# Patient Record
Sex: Male | Born: 1985 | Race: White | Hispanic: No | Marital: Married | Smoking: Current every day smoker
Health system: Southern US, Community
[De-identification: ages and names within clinical notes are randomized; demographics above are authoritative.]

## PROBLEM LIST (undated history)

## (undated) ENCOUNTER — Ambulatory Visit (HOSPITAL_COMMUNITY)
Disposition: A | Discharge status: Home / Self Care | Payer: 59 | Source: Ambulatory Visit | Attending: Psychiatry | Admitting: Psychiatry

## (undated) DIAGNOSIS — M542 Cervicalgia: Secondary | ICD-10-CM

## (undated) HISTORY — PX: ADENOIDECTOMY: SUR15

## (undated) HISTORY — PX: TONSILLECTOMY: SUR1361

## (undated) HISTORY — PX: BACK SURGERY: SHX140

## (undated) NOTE — *Deleted (*Deleted)
Physician Discharge Summary Note  Patient:  Philip Lee is an 50 y.o., male MRN:  [id number] DOB:  1986/01/19 Patient phone:  [phone] (home)  Patient address:   [address] [place] [address],  Total Time spent with patient: {Time; 15 min - 8 hours:17441}  Date of Admission:  06/21/2020 Date of Discharge: ***  Reason for Admission:  ***  Principal Problem: Severe recurrent major depression without psychotic features [facility]) Discharge Diagnoses: Principal Problem:   Severe recurrent major depression without psychotic features (HCC) Active Problems:   Panic attack   Generalized anxiety disorder   Marijuana use   Past Psychiatric History: ***  Past Medical History:  Past Medical History:  Diagnosis Date  . Cervical pain     Past Surgical History:  Procedure Laterality Date  . ADENOIDECTOMY    . BACK SURGERY    . TONSILLECTOMY     Family History: History reviewed. No pertinent family history. Family Psychiatric  History: *** Social History:  Social History   Substance and Sexual Activity  Alcohol Use Not Currently     Social History   Substance and Sexual Activity  Drug Use Not Currently    Social History   Socioeconomic History  . Marital status: Married    Spouse name: Not on file  . Number of children: Not on file  . Years of education: Not on file  . Highest education level: Not on file  Occupational History  . Not on file  Tobacco Use  . Smoking status: Current Every Day Smoker    Packs/day: 0.50    Types: Cigarettes  . Smokeless tobacco: Never Used  Substance and Sexual Activity  . Alcohol use: Not Currently  . Drug use: Not Currently  . Sexual activity: Not on file  Other Topics Concern  . Not on file  Social History Narrative  . Not on file   Social Determinants of Health   Financial Resource Strain:   . Difficulty of Paying Living Expenses: Not on file  Food Insecurity:   . Worried About Programme researcher, broadcasting/film/video in the  Last Year: Not on file  . Ran Out of Food in the Last Year: Not on file  Transportation Needs:   . Lack of Transportation (Medical): Not on file  . Lack of Transportation (Non-Medical): Not on file  Physical Activity:   . Days of Exercise per Week: Not on file  . Minutes of Exercise per Session: Not on file  Stress:   . Feeling of Stress : Not on file  Social Connections:   . Frequency of Communication with Friends and Family: Not on file  . Frequency of Social Gatherings with Friends and Family: Not on file  . Attends Religious Services: Not on file  . Active Member of Clubs or Organizations: Not on file  . Attends Banker Meetings: Not on file  . Marital Status: Not on file    Hospital Course:  ***  Physical Findings: AIMS: Facial and Oral Movements Muscles of Facial Expression: None, normal Lips and Perioral Area: None, normal Jaw: None, normal Tongue: None, normal,Extremity Movements Upper (arms, wrists, hands, fingers): None, normal Lower (legs, knees, ankles, toes): None, normal, Trunk Movements Neck, shoulders, hips: None, normal, Overall Severity Severity of abnormal movements (highest score from questions above): None, normal Incapacitation due to abnormal movements: None, normal Patient's awareness of abnormal movements (rate only patient's report): No Awareness, Dental Status Current problems with teeth and/or dentures?: No Does patient  usually wear dentures?: No  CIWA:  CIWA-Ar Total: 5 COWS:  COWS Total Score: 0  Musculoskeletal: Strength & Muscle Tone: {desc; muscle tone:32375} Gait & Station: {PE GAIT ED WJXB:14782} Patient leans: {Patient Leans:21022755}  Psychiatric Specialty Exam: Physical Exam  Review of Systems  Blood pressure 106/65, pulse (!) 51, temperature 98.5 F (36.9 C), temperature source Oral, resp. rate 20, height 5\' 10"  (1.778 m), weight 73.5 kg, SpO2 100 %.Body mass index is 23.24 kg/m.  General Appearance:  {Appearance:22683}  Eye Contact:  {BHH EYE CONTACT:22684}  Speech:  {Speech:22685}  Volume:  {Volume (PAA):22686}  Mood:  {BHH MOOD:22306}  Affect:  {Affect (PAA):22687}  Thought Process:  {Thought Process (PAA):22688}  Orientation:  {BHH ORIENTATION (PAA):22689}  Thought Content:  {Thought Content:22690}  Suicidal Thoughts:  {ST/HT (PAA):22692}  Homicidal Thoughts:  {ST/HT (PAA):22692}  Memory:  {BHH MEMORY:22881}  Judgement:  {Judgement (PAA):22694}  Insight:  {Insight (PAA):22695}  Psychomotor Activity:  {Psychomotor (PAA):22696}  Concentration:  {Concentration:21399}  Recall:  {BHH GOOD/FAIR/POOR:22877}  Fund of Knowledge:  {BHH GOOD/FAIR/POOR:22877}  Language:  {BHH GOOD/FAIR/POOR:22877}  Akathisia:  {BHH YES OR NO:22294}  Handed:  {Handed:22697}  AIMS (if indicated):     Assets:  {Assets (PAA):22698}  ADL's:  {BHH NFA'O:13086}  Cognition:  {chl bhh cognition:304700322}  Sleep:  Number of Hours: 6.75     Have you used any form of tobacco in the last 30 days? (Cigarettes, Smokeless Tobacco, Cigars, and/or Pipes): Yes  Has this patient used any form of tobacco in the last 30 days? (Cigarettes, Smokeless Tobacco, Cigars, and/or Pipes) Yes, {CHL BHH Tobacco:304700208}  Blood Alcohol level:  No results found for: [facility]  Metabolic Disorder Labs:  Lab Results  Component Value Date   HGBA1C 5.2 06/21/2020   MPG 103 06/21/2020   No results found for: PROLACTIN Lab Results  Component Value Date   CHOL 191 06/21/2020   TRIG 76 06/21/2020   HDL 53 06/21/2020   CHOLHDL 3.6 06/21/2020   VLDL 15 06/21/2020   LDLCALC 123 (H) 06/21/2020    See Psychiatric Specialty Exam and Suicide Risk Assessment completed by Attending Physician prior to discharge.  Discharge destination:  {DISCHARGE DESTINATION:22616}  Is patient on multiple antipsychotic therapies at discharge:  {RECOMMEND TAPERING:22617}   Has Patient had three or more failed trials of antipsychotic monotherapy by  history:  {BHH ANTIPSYCHOTIC:22903}  Recommended Plan for Multiple Antipsychotic Therapies: {BHH MULTIPLE ANTIPSYCHOTIC THERAPIES:22905}  Discharge Instructions    Discharge instructions   Complete by: As directed    Activity as tolerated. Diet as recommended by primary care physician. Keep all scheduled follow-up appointments as recommended.     Allergies as of 06/24/2020      Reactions   Lactose Intolerance (gi)       Medication List    STOP taking these medications   cyclobenzaprine 10 MG tablet Commonly known as: FLEXERIL   FLUoxetine 20 MG capsule Commonly known as: PROZAC     TAKE these medications     Indication  DULoxetine HCl 40 MG Cpep Take 40 mg by mouth daily. Start taking on: June 25, 2020  Indication: Major Depressive Disorder   hydrOXYzine 25 MG tablet Commonly known as: ATARAX/VISTARIL Take 1 tablet (25 mg total) by mouth 3 (three) times daily as needed for anxiety.  Indication: Feeling Anxious   traZODone 50 MG tablet Commonly known as: DESYREL Take 1 tablet (50 mg total) by mouth at bedtime as needed for sleep.  Indication: Trouble Sleeping  Follow-up Information    BEHAVIORAL HEALTH OUTPATIENT THERAPY [place] Follow up on 07/02/2020.   Specialty: Behavioral Health Why: You have an appointment on 07/02/20 at 2:00 pm for therapy services.  This will be a Virtual appointment.  Contact information: [address] [id number] mc [place] [address] [phone]       [facility] Go on 07/09/2020.   Why: You have an appointment for medication management on 07/09/20 at 10:00 am.  This appointment will be held in person.  Contact information: [address] [place], [place] [address]  Phone: [phone]  Fax: [phone]               Follow-up recommendations:  {BHH DC FU RECOMMENDATIONS:22620}  Comments:  ***  Signed: Aldean Baker, NP 06/24/2020, 1:09 PM

---

## 2012-04-15 ENCOUNTER — Encounter (HOSPITAL_BASED_OUTPATIENT_CLINIC_OR_DEPARTMENT_OTHER): Payer: Self-pay

## 2012-04-15 ENCOUNTER — Emergency Department (HOSPITAL_BASED_OUTPATIENT_CLINIC_OR_DEPARTMENT_OTHER)
Admission: EM | Admit: 2012-04-15 | Discharge: 2012-04-15 | Disposition: A | Discharge status: Home / Self Care | Payer: Self-pay | Attending: Emergency Medicine | Admitting: Emergency Medicine

## 2012-04-15 DIAGNOSIS — K047 Periapical abscess without sinus: Secondary | ICD-10-CM | POA: Insufficient documentation

## 2012-04-15 MED ORDER — HYDROCODONE-ACETAMINOPHEN 5-325 MG PO TABS
1.0000 | ORAL_TABLET | Freq: Once | ORAL | Status: AC
  Administered 2012-04-15: 1 via ORAL
  Filled 2012-04-15: qty 1

## 2012-04-15 MED ORDER — HYDROCODONE-ACETAMINOPHEN 5-500 MG PO TABS: 1.0000 | ORAL_TABLET | Freq: Four times a day (QID) | ORAL | Status: AC | PRN

## 2012-04-15 MED ORDER — PENICILLIN V POTASSIUM 500 MG PO TABS: 500.0000 mg | ORAL_TABLET | Freq: Four times a day (QID) | ORAL | Status: AC

## 2012-04-15 MED ORDER — IBUPROFEN 600 MG PO TABS: 600.0000 mg | ORAL_TABLET | Freq: Four times a day (QID) | ORAL | Status: AC | PRN

## 2012-04-15 NOTE — ED Provider Notes (Signed)
History     CSN: [id number]  Arrival date & time 04/15/12  1729   First MD Initiated Contact with Patient 04/15/12 1735      Chief Complaint  Patient presents with  . Dental Pain    (Consider location/radiation/quality/duration/timing/severity/associated sxs/prior treatment) Patient is a 26 y.o. male presenting with tooth pain. The history is provided by the patient.  Dental PainThe primary symptoms include mouth pain. Primary symptoms do not include headaches, fever or sore throat. The symptoms began 3 to 5 days ago. The symptoms are worsening. The symptoms are new. The symptoms occur constantly.  Additional symptoms include: gum swelling, gum tenderness, jaw pain and facial swelling. Additional symptoms do not include: trismus, trouble swallowing, pain with swallowing and ear pain.  Pt with left lower dental pain for 6 days, facial swelling and increased pain for two days. States pain with chewing and palpation of the gum and tooth. No fever, chills, malaise. Taking ibuprofen with mild relief.   History reviewed. No pertinent past medical history.  Past Surgical History  Procedure Date  . Tonsillectomy   . Adenoidectomy     No family history on file.  History  Substance Use Topics  . Smoking status: Current Some Day Smoker  . Smokeless tobacco: Not on file  . Alcohol Use: Yes      Review of Systems  Constitutional: Negative for fever and chills.  HENT: Positive for facial swelling and dental problem. Negative for ear pain, sore throat and trouble swallowing.   Respiratory: Negative.   Cardiovascular: Negative.   Musculoskeletal: Negative for myalgias and arthralgias.  Skin: Negative for rash.  Neurological: Negative for dizziness, weakness and headaches.    Allergies  Review of patient's allergies indicates no known allergies.  Home Medications   Current Outpatient Rx  Name Route Sig Dispense Refill  . IBUPROFEN 200 MG PO TABS Oral Take 400 mg by mouth every 6  (six) hours as needed. For pain.      BP 116/67  Pulse 79  Temp 98.2 F (36.8 C) (Oral)  Resp 16  Ht 5\' 10"  (1.778 m)  Wt 160 lb (72.576 kg)  BMI 22.96 kg/m2  SpO2 99%  Physical Exam  Nursing note and vitals reviewed. Constitutional: He is oriented to person, place, and time. He appears well-developed and well-nourished. No distress.  HENT:       Left lower 1st molar appears to have carries with surrounding gum swelling, tenderness. Small dental abscess present  Eyes: Conjunctivae are normal.  Neck: Neck supple.  Cardiovascular: Normal rate, regular rhythm and normal heart sounds.   Pulmonary/Chest: Effort normal and breath sounds normal. No respiratory distress. He has no wheezes.  Musculoskeletal: Normal range of motion.  Lymphadenopathy:    He has no cervical adenopathy.  Neurological: He is alert and oriented to person, place, and time.  Skin: Skin is warm and dry.  Psychiatric: He has a normal mood and affect.    ED Course  Procedures (including critical care time)  Pt with left lower 1st molar dental abscess. Will start on antibiotics, pain management. Follow up with dentist/oral surgery. Pt otherwise non toxic. Afebrile.    1. Dental abscess       MDM          Lottie Mussel, PA 04/15/12 2232

## 2012-04-15 NOTE — ED Notes (Signed)
Left lower toothache x 1 week-swelling noted to jaw

## 2012-04-17 NOTE — ED Provider Notes (Signed)
Medical screening examination/treatment/procedure(s) were performed by non-physician practitioner and as supervising physician I was immediately available for consultation/collaboration.   Willisha Sligar, MD 04/17/12 0721 

## 2013-04-03 ENCOUNTER — Encounter (HOSPITAL_BASED_OUTPATIENT_CLINIC_OR_DEPARTMENT_OTHER): Payer: Self-pay | Admitting: *Deleted

## 2013-04-03 ENCOUNTER — Emergency Department (HOSPITAL_BASED_OUTPATIENT_CLINIC_OR_DEPARTMENT_OTHER)
Admission: EM | Admit: 2013-04-03 | Discharge: 2013-04-04 | Disposition: A | Discharge status: Home / Self Care | Payer: Self-pay | Attending: Emergency Medicine | Admitting: Emergency Medicine

## 2013-04-03 DIAGNOSIS — X088XXA Exposure to other specified smoke, fire and flames, initial encounter: Secondary | ICD-10-CM | POA: Insufficient documentation

## 2013-04-03 DIAGNOSIS — Y929 Unspecified place or not applicable: Secondary | ICD-10-CM | POA: Insufficient documentation

## 2013-04-03 DIAGNOSIS — T2640XA Burn of unspecified eye and adnexa, part unspecified, initial encounter: Secondary | ICD-10-CM | POA: Insufficient documentation

## 2013-04-03 DIAGNOSIS — F17200 Nicotine dependence, unspecified, uncomplicated: Secondary | ICD-10-CM | POA: Insufficient documentation

## 2013-04-03 DIAGNOSIS — Y9389 Activity, other specified: Secondary | ICD-10-CM | POA: Insufficient documentation

## 2013-04-03 DIAGNOSIS — T2641XA Burn of right eye and adnexa, part unspecified, initial encounter: Secondary | ICD-10-CM

## 2013-04-03 MED ORDER — ERYTHROMYCIN 5 MG/GM OP OINT
TOPICAL_OINTMENT | Freq: Four times a day (QID) | OPHTHALMIC | Status: DC
  Administered 2013-04-03: 1 via OPHTHALMIC
  Filled 2013-04-03: qty 3.5

## 2013-04-03 MED ORDER — HYDROCODONE-ACETAMINOPHEN 5-325 MG PO TABS: 1.0000 | ORAL_TABLET | Freq: Four times a day (QID) | ORAL | Status: DC | PRN

## 2013-04-03 MED ORDER — FLUORESCEIN SODIUM 1 MG OP STRP
ORAL_STRIP | OPHTHALMIC | Status: AC
  Administered 2013-04-03: 1
  Filled 2013-04-03: qty 1

## 2013-04-03 MED ORDER — HYDROCODONE-ACETAMINOPHEN 5-325 MG PO TABS
1.0000 | ORAL_TABLET | Freq: Once | ORAL | Status: AC
  Administered 2013-04-03: 1 via ORAL
  Filled 2013-04-03: qty 1

## 2013-04-03 MED ORDER — TETRACAINE HCL 0.5 % OP SOLN
OPHTHALMIC | Status: AC
  Administered 2013-04-03: 2 [drp]
  Filled 2013-04-03: qty 2
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
## 2013-09-28 ENCOUNTER — Emergency Department (HOSPITAL_BASED_OUTPATIENT_CLINIC_OR_DEPARTMENT_OTHER)
Admission: EM | Admit: 2013-09-28 | Discharge: 2013-09-28 | Disposition: A | Discharge status: Home / Self Care | Payer: BC Managed Care – PPO | Attending: Emergency Medicine | Admitting: Emergency Medicine

## 2013-09-28 ENCOUNTER — Encounter (HOSPITAL_BASED_OUTPATIENT_CLINIC_OR_DEPARTMENT_OTHER): Payer: Self-pay | Admitting: Emergency Medicine

## 2013-09-28 DIAGNOSIS — F17200 Nicotine dependence, unspecified, uncomplicated: Secondary | ICD-10-CM | POA: Insufficient documentation

## 2013-09-28 DIAGNOSIS — K0381 Cracked tooth: Secondary | ICD-10-CM | POA: Insufficient documentation

## 2013-09-28 DIAGNOSIS — K029 Dental caries, unspecified: Secondary | ICD-10-CM | POA: Insufficient documentation

## 2013-09-28 MED ORDER — MELOXICAM 7.5 MG PO TABS
7.5000 mg | ORAL_TABLET | Freq: Every day | ORAL | Status: DC
Start: 2013-09-28 — End: 2015-07-09

## 2013-09-28 MED ORDER — PENICILLIN V POTASSIUM 250 MG PO TABS
500.0000 mg | ORAL_TABLET | Freq: Once | ORAL | Status: AC
  Administered 2013-09-28: 500 mg via ORAL
  Filled 2013-09-28: qty 2

## 2013-09-28 MED ORDER — OXYCODONE-ACETAMINOPHEN 5-325 MG PO TABS
1.0000 | ORAL_TABLET | Freq: Once | ORAL | Status: AC
  Administered 2013-09-28: 1 via ORAL
  Filled 2013-09-28: qty 1

## 2013-09-28 MED ORDER — PENICILLIN V POTASSIUM 500 MG PO TABS: 500.0000 mg | ORAL_TABLET | Freq: Four times a day (QID) | ORAL | Status: AC

## 2013-09-28 NOTE — Discharge Instructions (Signed)
Dental Care and Dentist Visits Dental care supports good overall health. Regular dental visits can also help you avoid dental pain, bleeding, infection, and other more serious health problems in the future. It is important to keep the mouth healthy because diseases in the teeth, gums, and other oral tissues can spread to other areas of the body. Some problems, such as diabetes, heart disease, and pre-term labor have been associated with poor oral health.  See your dentist every 6 months. If you experience emergency problems such as a toothache or broken tooth, go to the dentist right away. If you see your dentist regularly, you may catch problems early. It is easier to be treated for problems in the early stages.  WHAT TO EXPECT AT A DENTIST VISIT  Your dentist will look for many common oral health problems and recommend proper treatment. At your regular dental visit, you can expect:  Gentle cleaning of the teeth and gums. This includes scraping and polishing. This helps to remove the sticky substance around the teeth and gums (plaque). Plaque forms in the mouth shortly after eating. Over time, plaque hardens on the teeth as tartar. If tartar is not removed regularly, it can cause problems. Cleaning also helps remove stains.  Periodic X-rays. These pictures of the teeth and supporting bone will help your dentist assess the health of your teeth.  Periodic fluoride treatments. Fluoride is a natural mineral shown to help strengthen teeth. Fluoride treatmentinvolves applying a fluoride gel or varnish to the teeth. It is most commonly done in children.  Examination of the mouth, tongue, jaws, teeth, and gums to look for any oral health problems, such as:  Cavities (dental caries). This is decay on the tooth caused by plaque, sugar, and acid in the mouth. It is best to catch a cavity when it is small.  Inflammation of the gums caused by plaque buildup (gingivitis).  Problems with the mouth or malformed  or misaligned teeth.  Oral cancer or other diseases of the soft tissues or jaws. KEEP YOUR TEETH AND GUMS HEALTHY For healthy teeth and gums, follow these general guidelines as well as your dentist's specific advice:  Have your teeth professionally cleaned at the dentist every 6 months.  Brush twice daily with a fluoride toothpaste.  Floss your teeth daily.  Ask your dentist if you need fluoride supplements, treatments, or fluoride toothpaste.  Eat a healthy diet. Reduce foods and drinks with added sugar.  Avoid smoking. TREATMENT FOR ORAL HEALTH PROBLEMS If you have oral health problems, treatment varies depending on the conditions present in your teeth and gums.  Your caregiver will most likely recommend good oral hygiene at each visit.  For cavities, gingivitis, or other oral health disease, your caregiver will perform a procedure to treat the problem. This is typically done at a separate appointment. Sometimes your caregiver will refer you to another dental specialist for specific tooth problems or for surgery. SEEK IMMEDIATE DENTAL CARE IF:  You have pain, bleeding, or soreness in the gum, tooth, jaw, or mouth area.  A permanent tooth becomes loose or separated from the gum socket.  You experience a blow or injury to the mouth or jaw area. Document Released: 05/07/2011 Document Revised: 11/17/2011 Document Reviewed: 05/07/2011 [employer] Patient Information 2014 [employer].  

## 2013-09-28 NOTE — ED Notes (Signed)
Pt c/o dental pain x 3 days.

## 2013-09-28 NOTE — ED Provider Notes (Signed)
CSN: [id number]     Arrival date & time 09/28/13  2119 History  This chart was scribed for Philip Benzel Smitty CordsK Korbin Mapps-Rasch, MD by Philip Lee, ED scribe.  This patient was seen in room MH09/MH09 and the patient's care was started at 11:22 PM.   Chief Complaint  Patient presents with  . Dental Pain    Patient is a 28 y.o. male presenting with tooth pain. The history is provided by the patient. No language interpreter was used.  Dental Pain Location:  Lower Lower teeth location:  19/LL 1st molar (left) Quality:  Dull Severity:  Moderate Onset quality:  Gradual Duration:  3 days Timing:  Constant Progression:  Worsening Chronicity:  New Context: dental fracture   Context comment:  Cracked tooth Relieved by:  Acetaminophen and NSAIDs Worsened by:  Nothing tried Ineffective treatments:  None tried Associated symptoms: no drooling and no fever   Risk factors: lack of dental care and smoking     HPI Comments: Philip Lee is a 28 y.o. male who presents to the Emergency Department complaining of 3 days of progressively-worsening lower left-sided dental pain.  Pt states he has had a cracked tooth in that area "for a while."  He localizes pain to "it feels like my whole jaw."  He has attempted to treat pain with BC Powders and Tylenol, with some temporary relief.  He has no dentist but states he plans to get one.   History reviewed. No pertinent past medical history.  Past Surgical History  Procedure Laterality Date  . Tonsillectomy    . Adenoidectomy      History reviewed. No pertinent family history.   History  Substance Use Topics  . Smoking status: Current Some Day Smoker -- 0.50 packs/day    Types: Cigarettes  . Smokeless tobacco: Not on file  . Alcohol Use: Yes     Review of Systems  Constitutional: Negative for fever.  HENT: Positive for dental problem. Negative for drooling.   All other systems reviewed and are negative.     Allergies  Review of patient's  allergies indicates no known allergies.  Home Medications   Current Outpatient Rx  Name  Route  Sig  Dispense  Refill  . HYDROcodone-acetaminophen (NORCO/VICODIN) 5-325 MG per tablet   Oral   Take 1-2 tablets by mouth every 6 (six) hours as needed for pain.   20 tablet   0   . ibuprofen (ADVIL,MOTRIN) 200 MG tablet   Oral   Take 400 mg by mouth every 6 (six) hours as needed. For pain.          BP 131/75  Pulse 84  Temp(Src) 99.2 F (37.3 C) (Oral)  Resp 16  Ht 5\' 10"  (1.778 m)  Wt 160 lb (72.576 kg)  BMI 22.96 kg/m2  SpO2 100%  Physical Exam  Nursing note and vitals reviewed. Constitutional: He is oriented to person, place, and time. He appears well-developed and well-nourished. No distress.  HENT:  Head: Normocephalic and atraumatic.  Mouth/Throat: Uvula is midline and oropharynx is clear and moist. No oropharyngeal exudate.  1st left lower molar cracked in half below the gum line No facial swelling No swelling of the jaw  Eyes: EOM are normal. Pupils are equal, round, and reactive to light.  Neck: Normal range of motion. Neck supple. No tracheal deviation present.  Cardiovascular: Normal rate, regular rhythm and normal heart sounds.   No murmur heard. Pulmonary/Chest: Effort normal and breath sounds normal. No respiratory distress. He has  no wheezes. He has no rales.  Abdominal: Soft. Bowel sounds are normal. There is no tenderness.  Musculoskeletal: Normal range of motion.  Lymphadenopathy:    He has no cervical adenopathy.  Neurological: He is alert and oriented to person, place, and time.  Skin: Skin is warm and dry.  Psychiatric: He has a normal mood and affect. His behavior is normal.    ED Course  Procedures (including critical care time)  DIAGNOSTIC STUDIES: Oxygen Saturation is 100% on room air, normal by my interpretation.    COORDINATION OF CARE: 11:25 PM-Discussed treatment plan which includes pain medication, antibiotics, and dental f/u with pt  at bedside and pt agreed to plan.    Labs Review Labs Reviewed - No data to display  Imaging Review No results found.  EKG Interpretation   None       MDM  No diagnosis found. Follow up with dentistry for ongoing care   I personally performed the services described in this documentation, which was scribed in my presence. The recorded information has been reviewed and is accurate.    Philip Awe, MD 09/29/13 [phone]

## 2015-07-09 ENCOUNTER — Encounter (HOSPITAL_COMMUNITY): Payer: Self-pay | Admitting: Emergency Medicine

## 2015-07-09 ENCOUNTER — Emergency Department (HOSPITAL_COMMUNITY)
Admission: EM | Admit: 2015-07-09 | Discharge: 2015-07-09 | Disposition: A | Discharge status: Home / Self Care | Payer: BLUE CROSS/BLUE SHIELD | Attending: Emergency Medicine | Admitting: Emergency Medicine

## 2015-07-09 DIAGNOSIS — L03211 Cellulitis of face: Secondary | ICD-10-CM | POA: Insufficient documentation

## 2015-07-09 DIAGNOSIS — Z720 Tobacco use: Secondary | ICD-10-CM | POA: Insufficient documentation

## 2015-07-09 DIAGNOSIS — J340 Abscess, furuncle and carbuncle of nose: Secondary | ICD-10-CM | POA: Diagnosis present

## 2015-07-09 MED ORDER — SULFAMETHOXAZOLE-TRIMETHOPRIM 800-160 MG PO TABS
1.0000 | ORAL_TABLET | Freq: Once | ORAL | Status: AC
  Administered 2015-07-09: 1 via ORAL
  Filled 2015-07-09: qty 1

## 2015-07-09 MED ORDER — HYDROCODONE-ACETAMINOPHEN 5-325 MG PO TABS
2.0000 | ORAL_TABLET | Freq: Once | ORAL | Status: AC
  Administered 2015-07-09: 2 via ORAL
  Filled 2015-07-09: qty 2

## 2015-07-09 MED ORDER — MELOXICAM 15 MG PO TABS: 15.0000 mg | ORAL_TABLET | Freq: Every day | ORAL | Status: DC

## 2015-07-09 MED ORDER — SULFAMETHOXAZOLE-TRIMETHOPRIM 800-160 MG PO TABS: 1.0000 | ORAL_TABLET | Freq: Two times a day (BID) | ORAL | Status: DC

## 2015-07-09 NOTE — ED Notes (Signed)
Pt states he felt like there was a zit in his nose over the weekend that he 'popped' this morning. States over the day it's gotten larger and more irritated. Obvious head to inside of right nares. Mild swelling at base of nose, no swelling observed near gum/upper lip however poor dentition/broken front two teeth. States he has had facial abscesses in the past from his teeth on the bottom, was given antibiotics and had his tooth removed.

## 2015-07-09 NOTE — Discharge Instructions (Signed)
Take Bactrim as directed until gone. Take Mobic as needed for pain. Refer to attached documents for more information.

## 2015-07-09 NOTE — ED Provider Notes (Signed)
History  By signing my name below, I, Karle PlumberJennifer Tensley, attest that this documentation has been prepared under the direction and in the presence of [employer] Holding CorporationKaitlyn Catia Todorov, PA-C. Electronically Signed: Karle PlumberJennifer Tensley, ED Scribe. 07/09/2015. 9:15 PM.  Chief Complaint  Patient presents with  . Abscess   The history is provided by the patient and medical records. No language interpreter was used.    HPI Comments:  Philip Lee is a 29 y.o. male who presents to the Emergency Department complaining of an abscess at the base of his right nares that appeared about 3 days ago. He states he manipulated the area until pus came out earlier today. He reports expressing more purulence about five hours ago and reports more redness and irritation since. He reports taking Ibuprofen with minimal relief of the pain. He denies modifying factors. He denies fever, chills, nausea or vomiting.   History reviewed. No pertinent past medical history. Past Surgical History  Procedure Laterality Date  . Tonsillectomy    . Adenoidectomy     History reviewed. No pertinent family history. Social History  Substance Use Topics  . Smoking status: Current Some Day Smoker -- 0.50 packs/day    Types: Cigarettes  . Smokeless tobacco: None  . Alcohol Use: Yes    Review of Systems  Skin: Positive for color change.  All other systems reviewed and are negative.   Allergies  Review of patient's allergies indicates no known allergies.  Home Medications   Prior to Admission medications   Medication Sig Start Date End Date Taking? Authorizing Provider  HYDROcodone-acetaminophen (NORCO/VICODIN) 5-325 MG per tablet Take 1-2 tablets by mouth every 6 (six) hours as needed for pain. 04/03/13   John Molpus, MD  ibuprofen (ADVIL,MOTRIN) 200 MG tablet Take 400 mg by mouth every 6 (six) hours as needed. For pain.    Historical Provider, MD  meloxicam (MOBIC) 15 MG tablet Take 1 tablet (15 mg total) by mouth daily. 07/09/15    Osamu Olguin, PA-C  sulfamethoxazole-trimethoprim (BACTRIM DS) 800-160 MG tablet Take 1 tablet by mouth 2 (two) times daily. 07/09/15   Emilia BeckKaitlyn Tamya Denardo, PA-C   Triage Vitals: BP 125/65 mmHg  Pulse 72  Temp(Src) 98.5 F (36.9 C) (Oral)  Resp 18  SpO2 100% Physical Exam  Constitutional: He is oriented to person, place, and time. He appears well-developed and well-nourished. No distress.  HENT:  Head: Normocephalic and atraumatic.  Mouth/Throat: Oropharynx is clear and moist. No oropharyngeal exudate.  Erythema, induration and swelling just inside right nares with tenderness to palpation. No open wound. No drainage or discharge.  Eyes: Conjunctivae and EOM are normal.  Normal appearance  Neck: Normal range of motion.  Cardiovascular: Normal rate and regular rhythm.   Pulmonary/Chest: Effort normal.  Abdominal: Soft. He exhibits no distension. There is no tenderness. There is no rebound.  Musculoskeletal: Normal range of motion.  Neurological: He is alert and oriented to person, place, and time. Coordination normal.  Skin: Skin is warm.  Psychiatric: He has a normal mood and affect. His behavior is normal.  Nursing note and vitals reviewed.   ED Course  Procedures (including critical care time) DIAGNOSTIC STUDIES: Oxygen Saturation is 100% on RA, normal by my interpretation.   COORDINATION OF CARE: 9:14 PM- Will prescribe antibiotics and Mobic. Pt verbalizes understanding and agrees to plan.  Medications  sulfamethoxazole-trimethoprim (BACTRIM DS,SEPTRA DS) 800-160 MG per tablet 1 tablet (not administered)  HYDROcodone-acetaminophen (NORCO/VICODIN) 5-325 MG per tablet 2 tablet (not administered)    Labs Review  Labs Reviewed - No data to display  Imaging Review No results found. I have personally reviewed and evaluated these images and lab results as part of my medical decision-making.   EKG Interpretation None      MDM   Final diagnoses:  Cellulitis of face     Patient has localized area of cellulitis at the right nare. No open wounds or drainage. Vitals stable and patient afebrile.   I personally performed the services described in this documentation, which was scribed in my presence. The recorded information has been reviewed and is accurate.     Emilia Beck, PA-C 07/09/15 2334  Bethann Berkshire, MD 07/09/15 [phone]

## 2016-08-13 ENCOUNTER — Emergency Department (HOSPITAL_COMMUNITY): Payer: BLUE CROSS/BLUE SHIELD

## 2016-08-13 ENCOUNTER — Encounter (HOSPITAL_COMMUNITY): Payer: Self-pay | Admitting: Emergency Medicine

## 2016-08-13 ENCOUNTER — Emergency Department (HOSPITAL_COMMUNITY)
Admission: EM | Admit: 2016-08-13 | Discharge: 2016-08-13 | Disposition: A | Discharge status: Home / Self Care | Payer: BLUE CROSS/BLUE SHIELD | Attending: Emergency Medicine | Admitting: Emergency Medicine

## 2016-08-13 DIAGNOSIS — Z79899 Other long term (current) drug therapy: Secondary | ICD-10-CM | POA: Diagnosis not present

## 2016-08-13 DIAGNOSIS — F17210 Nicotine dependence, cigarettes, uncomplicated: Secondary | ICD-10-CM | POA: Diagnosis not present

## 2016-08-13 DIAGNOSIS — M542 Cervicalgia: Secondary | ICD-10-CM | POA: Diagnosis present

## 2016-08-13 DIAGNOSIS — M5412 Radiculopathy, cervical region: Secondary | ICD-10-CM | POA: Diagnosis not present

## 2016-08-13 HISTORY — DX: Cervicalgia: M54.2

## 2016-08-13 LAB — CBG MONITORING, ED: Glucose-Capillary: 174 mg/dL — ABNORMAL HIGH (ref 65–99)

## 2016-08-13 MED ORDER — HYDROCODONE-ACETAMINOPHEN 5-325 MG PO TABS: 1.0000 | ORAL_TABLET | Freq: Four times a day (QID) | ORAL | 0 refills | Status: DC | PRN

## 2016-08-13 MED ORDER — KETOROLAC TROMETHAMINE 60 MG/2ML IM SOLN
60.0000 mg | Freq: Once | INTRAMUSCULAR | Status: AC
  Administered 2016-08-13: 60 mg via INTRAMUSCULAR
  Filled 2016-08-13: qty 2

## 2016-08-13 MED ORDER — HYDROMORPHONE HCL 1 MG/ML IJ SOLN
1.0000 mg | Freq: Once | INTRAMUSCULAR | Status: AC
  Administered 2016-08-13: 1 mg via INTRAMUSCULAR
  Filled 2016-08-13: qty 1

## 2016-08-13 MED ORDER — NAPROXEN 375 MG PO TABS: 375.0000 mg | ORAL_TABLET | Freq: Two times a day (BID) | ORAL | 0 refills | Status: AC | PRN

## 2016-08-13 MED ORDER — DEXAMETHASONE 4 MG PO TABS
10.0000 mg | ORAL_TABLET | Freq: Once | ORAL | Status: AC
  Administered 2016-08-13: 10 mg via ORAL
  Filled 2016-08-13: qty 2

## 2016-08-13 NOTE — ED Provider Notes (Signed)
[facility]-EMERGENCY DEPT Provider Note   CSN: [id number] Arrival date & time: 08/13/16  1303     History   Chief Complaint Chief Complaint  Patient presents with  . post epuidural pain    HPI Philip Lee is a 30 y.o. male.  HPI 30 year old male with history of chronic cervical pain here with severe neck pain. The patient was at his pain clinic today where he underwent a cervical injection at C7 via his left lateral neck. He states that during the injection he had significantly more pain than his previous 2 injections. He then began to develop shooting, radiating, burning pain down to his left shoulder blade and felt mildly short of breath. He was watched for several minutes after the injection and started to walk to the car and his knees buckle due to pain. He was placed in a wheelchair and sent home for continued pain control. He states that since then his pain is not improved so subsequently presents for evaluation. Pain is sharp, stabbing pain radiating down to his left shoulder blade. Denies any current shortness of breath. Pain is improved with rest and worsened with any movement.  Past Medical History:  Diagnosis Date  . Cervical pain     There are no active problems to display for this patient.   Past Surgical History:  Procedure Laterality Date  . ADENOIDECTOMY    . TONSILLECTOMY         Home Medications    Prior to Admission medications   Medication Sig Start Date End Date Taking? Authorizing Provider  acetaminophen (TYLENOL) 500 MG tablet Take 500 mg by mouth every 6 (six) hours as needed.   Yes Historical Provider, MD  pregabalin (LYRICA) 50 MG capsule Take 50 mg by mouth 3 (three) times daily.   Yes Historical Provider, MD  traMADol (ULTRAM) 50 MG tablet Take 50 mg by mouth every 6 (six) hours as needed.   Yes Historical Provider, MD  HYDROcodone-acetaminophen (NORCO/VICODIN) 5-325 MG tablet Take 1-2 tablets by mouth every 6 (six) hours as needed for severe pain.  08/13/16   Shaune Pollackameron Chrystian Ressler, MD  meloxicam (MOBIC) 15 MG tablet Take 1 tablet (15 mg total) by mouth daily. Patient not taking: Reported on 08/13/2016 07/09/15   Emilia BeckKaitlyn Szekalski, PA-C  naproxen (NAPROSYN) 375 MG tablet Take 1 tablet (375 mg total) by mouth 2 (two) times daily as needed for moderate pain. 08/13/16 08/20/16  Shaune Pollackameron Valerie Fredin, MD    Family History No family history on file.  Social History Social History  Substance Use Topics  . Smoking status: Current Some Day Smoker    Packs/day: 0.50    Types: Cigarettes  . Smokeless tobacco: Not on file  . Alcohol use Yes     Allergies   Lactose intolerance (gi)   Review of Systems Review of Systems  Constitutional: Negative for chills, diaphoresis and fever.  HENT: Negative for congestion and rhinorrhea.   Eyes: Negative for visual disturbance.  Respiratory: Negative for cough, shortness of breath and wheezing.   Cardiovascular: Negative for chest pain and leg swelling.  Gastrointestinal: Negative for abdominal pain, diarrhea, nausea and vomiting.  Genitourinary: Negative for dysuria and flank pain.  Musculoskeletal: Positive for neck pain and neck stiffness. Negative for gait problem.  Skin: Negative for rash and wound.  Allergic/Immunologic: Negative for immunocompromised state.  Neurological: Negative for syncope, weakness and headaches.  All other systems reviewed and are negative.    Physical Exam Updated Vital Signs BP 116/65 (BP Location: Right  Arm)   Pulse 71   Temp 97.8 F (36.6 C)   Resp 18   SpO2 99%   Physical Exam  Constitutional: He is oriented to person, place, and time. He appears well-developed and well-nourished. No distress.  HENT:  Head: Normocephalic and atraumatic.  Eyes: Conjunctivae are normal.  Neck: Neck supple.  Injection site to left lateral neck is clean, dry, and intact. There is no surrounding edema, swelling, or hematoma. No carotid bruit. No stridor. Normal phonation.    Cardiovascular: Normal rate, regular rhythm and normal heart sounds.  Exam reveals no friction rub.   No murmur heard. Pulmonary/Chest: Effort normal and breath sounds normal. No respiratory distress. He has no wheezes. He has no rales.  Abdominal: He exhibits no distension.  Musculoskeletal: He exhibits no edema.  No midline TTP.  Neurological: He is alert and oriented to person, place, and time. He exhibits normal muscle tone.  Strength 5 out of 5 in proximal and distal upper extremities bilaterally. Normal sensation to light touch in bilateral UE and LE.  Skin: Skin is warm. Capillary refill takes less than 2 seconds.  Psychiatric: He has a normal mood and affect.  Nursing note and vitals reviewed.    ED Treatments / Results  Labs (all labs ordered are listed, but only abnormal results are displayed) Labs Reviewed  CBG MONITORING, ED - Abnormal; Notable for the following:       Result Value   Glucose-Capillary 174 (*)    All other components within normal limits    EKG  EKG Interpretation  Date/Time:  Wednesday August 13 2016 17:26:14 EST Ventricular Rate:  73 PR Interval:    QRS Duration: 105 QT Interval:  374 QTC Calculation: 413 R Axis:   84 Text Interpretation:  Sinus rhythm Borderline short PR interval RSR' in V1 or V2, right VCD or RVH EKG WITHIN NORMAL LIMITS No old tracing to compare Confirmed by [facility]SAACS MD, Trystan Akhtar [phone]([id number]) on 08/13/2016 5:32:39 PM       Radiology Dg Chest 2 View  Result Date: 08/13/2016 CLINICAL DATA:  Initial evaluation for possible hemidiaphragm elevation status post cervical spine injection. EXAM: CHEST  2 VIEW COMPARISON:  None. FINDINGS: The heart size and mediastinal contours are within normal limits. Both lungs are clear. The visualized skeletal structures are unremarkable. IMPRESSION: No active cardiopulmonary disease. No abnormal elevation of the hemidiaphragms identified on this exam. Electronically Signed   By: Rise MuBenjamin  McClintock  M.D.   On: 08/13/2016 17:23    Procedures Procedures (including critical care time)  Medications Ordered in ED Medications  HYDROmorphone (DILAUDID) injection 1 mg (1 mg Intramuscular Given 08/13/16 1645)  ketorolac (TORADOL) injection 60 mg (60 mg Intramuscular Given 08/13/16 1645)  dexamethasone (DECADRON) tablet 10 mg (10 mg Oral Given 08/13/16 1646)     Initial Impression / Assessment and Plan / ED Course  I have reviewed the triage vital signs and the nursing notes.  Pertinent labs & imaging results that were available during my care of the patient were reviewed by me and considered in my medical decision making (see chart for details).  Clinical Course     30 year old male with history of chronic neck pain status post cervical injection earlier this morning who presents with severe neck and shooting pain to left shoulder blade and arm. Primary suspicion is acute radiculopathy, possible neuropraxia due to recent local injection. His injection site is clean, dry, intact, without evidence of infection. There is no surrounding swelling or evidence of  hematoma or vascular injury. His neurovasculature is fully intact in his left upper extremity. He did report some mild shortness of breath, although this is likely due to pain. Chest x-ray shows no evidence of hemidiaphragm elevation to suggest phrenic nerve injury. He is satting well on room air with normal work of breathing. Otherwise, EKG is unremarkable and 2pocare glucose is normal. Symptoms are markedly improved after IM analgesia. Will refer for outpatient neurosurgical follow-up and give brief course of analgesics.  Final Clinical Impressions(s) / ED Diagnoses   Final diagnoses:  Cervical radiculopathy  Neck pain    New Prescriptions New Prescriptions   NAPROXEN (NAPROSYN) 375 MG TABLET    Take 1 tablet (375 mg total) by mouth 2 (two) times daily as needed for moderate pain.     Shaune Pollack, MD 08/13/16 [phone]

## 2016-08-13 NOTE — Progress Notes (Signed)
Pt seen by various providers at [address] [place] Pt states he has been to dr a lot but more in 2017 per pt

## 2016-08-13 NOTE — ED Triage Notes (Signed)
Per pt states he has chronic cervical pain-got an injection today-while he was getting injected he experienced increase pain-states his legs buckled as well-informed staff and they said they would put it in his chart and the MD would call him-no bowel/bladder incontinence

## 2018-02-11 ENCOUNTER — Emergency Department (HOSPITAL_BASED_OUTPATIENT_CLINIC_OR_DEPARTMENT_OTHER)
Admission: EM | Admit: 2018-02-11 | Discharge: 2018-02-11 | Disposition: A | Discharge status: Home / Self Care | Payer: PRIVATE HEALTH INSURANCE | Attending: Emergency Medicine | Admitting: Emergency Medicine

## 2018-02-11 ENCOUNTER — Other Ambulatory Visit: Payer: Self-pay

## 2018-02-11 ENCOUNTER — Emergency Department (HOSPITAL_BASED_OUTPATIENT_CLINIC_OR_DEPARTMENT_OTHER): Payer: PRIVATE HEALTH INSURANCE

## 2018-02-11 ENCOUNTER — Encounter (HOSPITAL_BASED_OUTPATIENT_CLINIC_OR_DEPARTMENT_OTHER): Payer: Self-pay

## 2018-02-11 DIAGNOSIS — F17210 Nicotine dependence, cigarettes, uncomplicated: Secondary | ICD-10-CM | POA: Insufficient documentation

## 2018-02-11 DIAGNOSIS — M79641 Pain in right hand: Secondary | ICD-10-CM | POA: Insufficient documentation

## 2018-02-11 DIAGNOSIS — S6991XA Unspecified injury of right wrist, hand and finger(s), initial encounter: Secondary | ICD-10-CM

## 2018-02-11 MED ORDER — IBUPROFEN 800 MG PO TABS
800.0000 mg | ORAL_TABLET | Freq: Once | ORAL | Status: AC
  Administered 2018-02-11: 800 mg via ORAL
  Filled 2018-02-11: qty 1

## 2018-02-11 NOTE — ED Provider Notes (Signed)
[facility] EMERGENCY DEPARTMENT Provider Note   CSN: [id number] Arrival date & time: 02/11/18  1927     History   Chief Complaint Chief Complaint  Patient presents with  . Hand Injury    HPI Philip Lee is a 32 y.o. male with a hx of tobacco abuse who presents to the ED s/p mechanical fall 1 hour PTA complaining of right hand pain.  Patient states he was ambulating down some steps, he misstepped, resulting in a fall- no CP/dyspnea/lightheadedness/dizzines leading to fall or at present.  He states he did not fall down several stairs.  He states that he fell onto the right hand which was in a fist.  He states he fell onto the dorsum of the hand.  No head injury or loss of consciousness.  Patient states he is having pain only to the right hand, rates his pain an 8 out of 10 in severity, worse with movement, no alleviating factors.  He has not had intervention prior to arrival.  Reports associated swelling. He denies any other areas of injury.  Denies numbness, weakness, neck pain, or back pain.  Patient is right-hand dominant.  HPI  Past Medical History:  Diagnosis Date  . Cervical pain     There are no active problems to display for this patient.   Past Surgical History:  Procedure Laterality Date  . ADENOIDECTOMY    . BACK SURGERY    . TONSILLECTOMY          Home Medications    Prior to Admission medications   Medication Sig Start Date End Date Taking? Authorizing Provider  acetaminophen (TYLENOL) 500 MG tablet Take 500 mg by mouth every 6 (six) hours as needed.    [provider]  HYDROcodone-acetaminophen (NORCO/VICODIN) 5-325 MG tablet Take 1-2 tablets by mouth every 6 (six) hours as needed for severe pain. 08/13/16   Shaune PollackIsaacs, Cameron, MD  meloxicam (MOBIC) 15 MG tablet Take 1 tablet (15 mg total) by mouth daily. Patient not taking: Reported on 08/13/2016 07/09/15   Emilia BeckSzekalski, Kaitlyn, PA-C  pregabalin (LYRICA) 50 MG capsule Take 50 mg by mouth 3  (three) times daily.    [provider]  traMADol (ULTRAM) 50 MG tablet Take 50 mg by mouth every 6 (six) hours as needed.    [provider]    Family History No family history on file.  Social History Social History   Tobacco Use  . Smoking status: Current Every Day Smoker    Packs/day: 0.50    Types: Cigarettes  . Smokeless tobacco: Never Used  Substance Use Topics  . Alcohol use: Not Currently  . Drug use: Not Currently     Allergies   Lactose intolerance (gi)   Review of Systems Review of Systems  Constitutional: Negative for chills and fever.  Eyes: Negative for visual disturbance.  Respiratory: Negative for shortness of breath.   Cardiovascular: Negative for chest pain.  Musculoskeletal:       Positive for R hand pain and swelling  Neurological: Negative for dizziness, weakness, light-headedness, numbness and headaches.     Physical Exam Updated Vital Signs BP 131/83 (BP Location: Left Arm)   Pulse 85   Temp 98.5 F (36.9 C) (Oral)   Resp 20   Ht 5\' 10"  (1.778 m)   Wt 80.7 kg (178 lb)   SpO2 99%   BMI 25.54 kg/m   Physical Exam  Constitutional: He appears well-developed and well-nourished. No distress.  HENT:  Head: Normocephalic  and atraumatic. Head is without raccoon's eyes and without Battle's sign.  Eyes: Conjunctivae are normal. Right eye exhibits no discharge. Left eye exhibits no discharge.  Neck: Normal range of motion. Neck supple.  No midline tenderness to palpation.  Cardiovascular: Normal rate and regular rhythm.  2+ symmetric radial pulses.  Pulmonary/Chest: Effort normal and breath sounds normal.  Abdominal: Soft. He exhibits no distension. There is no tenderness.  Musculoskeletal:  Patient has approximately 3 cm diameter contusion with swelling to the proximal dorsum of the right hand more to ulnar side.  The area is tender to palpation.  Other than this area there are no obvious deformities, appreciable swelling,  erythema, ecchymosis, or open wounds to the upper or lower extremities.  he has full range of motion to bilateral shoulders, elbows, wrists, and all digits.  Other than the dorsum of the right hand patient is nontender to palpation.  There is no point/focal bony tenderness.  He has no snuffbox tenderness. Back: No midline tenderness.  Neurological: He is alert.  Clear speech.  Patient has 5 out of 5 symmetric grip strength.  Sensation grossly intact bilateral upper extremities.  Patient is able to perform okay sign, thumbs up, and cross second and third digits bilaterally.  Psychiatric: He has a normal mood and affect. His behavior is normal. Thought content normal.  Nursing note and vitals reviewed.   ED Treatments / Results  Labs (all labs ordered are listed, but only abnormal results are displayed) Labs Reviewed - No data to display  EKG None  Radiology Dg Hand Complete Right  Result Date: 02/11/2018 CLINICAL DATA:  Fall down steps, right hand pain EXAM: RIGHT HAND - COMPLETE 3+ VIEW COMPARISON:  None. FINDINGS: No fracture or dislocation is seen. The joint spaces are preserved. On the lateral view, there is mild soft tissue swelling overlying the dorsal MCP joints. IMPRESSION: No fracture or dislocation is seen. Electronically Signed   By: Charline Bills M.D.   On: 02/11/2018 20:04    Procedures Procedures (including critical care time)  Medications Ordered in ED Medications  ibuprofen (ADVIL,MOTRIN) tablet 800 mg (has no administration in time range)     Initial Impression / Assessment and Plan / ED Course  I have reviewed the triage vital signs and the nursing notes.  Pertinent labs & imaging results that were available during my care of the patient were reviewed by me and considered in my medical decision making (see chart for details).   Patient presents status post mechanical fall with right hand pain.  Patient nontoxic-appearing, in no apparent distress, initial vitals  WNL.  Patient without signs of serious head neck or back injury status post fall.  His right hand is swollen and tender to palpation in the dorsal area, no snuffbox tenderness.  ROM intact. Patient neurovascularly intact distally.  X-ray obtained per triage negative for fractures or dislocations.  Will place patient in thumb spica brace, PRICE protocol, and recommend ibuprofen for pain/swelling. Will have patient follow up with sports medicine. I discussed results, treatment plan, need for follow-up, and return precautions with the patient. Provided opportunity for questions, patient confirmed understanding and is in agreement with plan.   Final Clinical Impressions(s) / ED Diagnoses   Final diagnoses:  Hand injury, right, initial encounter    ED Discharge Orders    None       Cherly Anderson, PA-C 02/11/18 2042    Little, Ambrose Finland, MD 02/15/18 [phone]

## 2018-02-11 NOTE — ED Triage Notes (Signed)
Pt states he fell down steps 1 hour PTA-c/o pain to right hand-denies other pain sites-NAD-steady gait

## 2018-02-11 NOTE — ED Notes (Signed)
Patient transported to X-ray 

## 2018-02-11 NOTE — ED Notes (Signed)
PMS intact before and after. Pt tolerated well. All questions answered. 

## 2018-02-11 NOTE — Discharge Instructions (Addendum)
Please read and follow all provided instructions.  You have been seen today for a right hand injury  Tests performed today include: An x-ray of the affected area - does NOT show any broken bones or dislocations.  Vital signs. See below for your results today.   Home care instructions: -- *PRICE in the first 24-48 hours after injury: Protect (with brace, splint, sling), if given by your provider Rest Ice- Do not apply ice pack directly to your skin, place towel or similar between your skin and ice/ice pack. Apply ice for 20 min, then remove for 40 min while awake Compression- Wear brace, elastic bandage, splint as directed by your provider Elevate affected extremity above the level of your heart when not walking around for the first 24-48 hours   Use Ibuprofen (Motrin/Advil) 600mg  every 6 hours as needed for pain (do not exceed max dose in 24 hours, 2400mg )  Follow-up instructions: Please follow-up with your primary care provider or the provided orthopedic physician (bone specialist) if you continue to have significant pain in 1 week. In this case you may have a more severe injury that requires further care.   Return instructions:  Please return if your fingers or hand are numb or tingling, appear gray or blue, or you have severe pain (also elevate the leg and loosen splint or wrap if you were given one) Please return to the Emergency Department if you experience worsening symptoms.  Please return if you have any other emergent concerns. Additional Information:  Your vital signs today were: BP 131/83 (BP Location: Left Arm)    Pulse 85    Temp 98.5 F (36.9 C) (Oral)    Resp 20    Ht 5\' 10"  (1.778 m)    Wt 80.7 kg (178 lb)    SpO2 99%    BMI 25.54 kg/m  If your blood pressure (BP) was elevated above 135/85 this visit, please have this repeated by your doctor within one month. ---------------

## 2019-07-28 ENCOUNTER — Other Ambulatory Visit: Payer: Self-pay

## 2019-07-28 DIAGNOSIS — Z20822 Contact with and (suspected) exposure to covid-19: Secondary | ICD-10-CM

## 2019-07-31 LAB — NOVEL CORONAVIRUS, NAA: SARS-CoV-2, NAA: NOT DETECTED

## 2019-08-09 ENCOUNTER — Other Ambulatory Visit: Payer: Self-pay

## 2019-08-09 DIAGNOSIS — Z20822 Contact with and (suspected) exposure to covid-19: Secondary | ICD-10-CM

## 2019-08-11 LAB — NOVEL CORONAVIRUS, NAA: SARS-CoV-2, NAA: NOT DETECTED

## 2019-09-15 ENCOUNTER — Ambulatory Visit: Payer: PRIVATE HEALTH INSURANCE | Attending: Internal Medicine

## 2019-09-15 DIAGNOSIS — Z20822 Contact with and (suspected) exposure to covid-19: Secondary | ICD-10-CM

## 2019-09-17 LAB — NOVEL CORONAVIRUS, NAA: SARS-CoV-2, NAA: NOT DETECTED

## 2019-09-23 DIAGNOSIS — F418 Other specified anxiety disorders: Secondary | ICD-10-CM | POA: Diagnosis not present

## 2020-01-15 ENCOUNTER — Encounter (HOSPITAL_COMMUNITY): Payer: Self-pay

## 2020-01-15 ENCOUNTER — Emergency Department (HOSPITAL_COMMUNITY)
Admission: EM | Admit: 2020-01-15 | Discharge: 2020-01-16 | Disposition: A | Discharge status: Home / Self Care | Payer: PRIVATE HEALTH INSURANCE | Attending: Emergency Medicine | Admitting: Emergency Medicine

## 2020-01-15 ENCOUNTER — Other Ambulatory Visit: Payer: Self-pay

## 2020-01-15 DIAGNOSIS — Z79899 Other long term (current) drug therapy: Secondary | ICD-10-CM | POA: Insufficient documentation

## 2020-01-15 DIAGNOSIS — F17210 Nicotine dependence, cigarettes, uncomplicated: Secondary | ICD-10-CM | POA: Insufficient documentation

## 2020-01-15 DIAGNOSIS — M5412 Radiculopathy, cervical region: Secondary | ICD-10-CM | POA: Insufficient documentation

## 2020-01-15 MED ORDER — HYDROCODONE-ACETAMINOPHEN 5-325 MG PO TABS
1.0000 | ORAL_TABLET | Freq: Once | ORAL | Status: AC
  Administered 2020-01-15: 1 via ORAL
  Filled 2020-01-15: qty 1

## 2020-01-15 MED ORDER — HYDROCODONE-ACETAMINOPHEN 5-325 MG PO TABS: 1.0000 | ORAL_TABLET | Freq: Four times a day (QID) | ORAL | 0 refills | Status: DC | PRN

## 2020-01-15 MED ORDER — METHOCARBAMOL 500 MG PO TABS: 500.0000 mg | ORAL_TABLET | Freq: Two times a day (BID) | ORAL | 0 refills | Status: DC

## 2020-01-15 MED ORDER — METHOCARBAMOL 500 MG PO TABS
500.0000 mg | ORAL_TABLET | Freq: Once | ORAL | Status: AC
  Administered 2020-01-15: 500 mg via ORAL
  Filled 2020-01-15: qty 1

## 2020-01-15 MED ORDER — NAPROXEN 500 MG PO TABS: 500.0000 mg | ORAL_TABLET | Freq: Two times a day (BID) | ORAL | 0 refills | Status: DC

## 2020-01-15 MED ORDER — LIDOCAINE 5 % EX PTCH
1.0000 | MEDICATED_PATCH | CUTANEOUS | Status: DC
  Administered 2020-01-15: 1 via TRANSDERMAL
  Filled 2020-01-15: qty 1

## 2020-01-15 NOTE — Discharge Instructions (Signed)
As discussed, I am sending you home with pain medication and muscle relaxer. I am sending you home with two different pain medications. Save the hydrocodone for severe pain. Robaxin is a muscle relaxer that can cause drowsiness, so do not drive or operate machinery while on the medication.  I have included the number for the neurosurgeon on-call.  Please call tomorrow to schedule an appointment for further evaluation.  Return to the ER for new or worsening symptoms.

## 2020-01-15 NOTE — ED Triage Notes (Signed)
Pt came into today with shoulder pain. 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 Pt states this pain reminds him of pain before the spinal fusion. The procedure was done 3 years ago.

## 2020-01-15 NOTE — ED Provider Notes (Signed)
[facility] EMERGENCY DEPARTMENT Provider Note   CSN: [id number] Arrival date & time: 01/15/20  2208     History No chief complaint on file.   Johsua Shevlin is a 34 y.o. male with a past medical history significant for chronic cervical pain who presents to the ED due to acute on chronic left-sided neck pain that radiates down to left shoulder blade that has progressively gotten worse over the past few months.  Patient notes pain sometimes radiates down back into the left leg. Pain is worse with head movement and occurs intermittently. Pain is sharp and a stabbing sensation. Admits to chronic left hand numbness/tingling. Denies LUE weakness. Denies neck/back injury.  Denies saddle paresthesias, bowel/bladder incontinence, lower extremity weakness, lower extremity numbness/tingling, history of cancer, and IV drug use.  Denies fever and chills. Patient notes pain gets so severe he occasionally gets short of breath. Denies chest pain. He has not tried anything recently for pain. Patient has a history of a cervical fusion 3 year ago. He notes he has been doing more work around the house lately taking care of his kids since his wife just had surgery.   History obtained from patient and past medical records. No interpreter used during encounter.      Past Medical History:  Diagnosis Date  . Cervical pain     There are no problems to display for this patient.   Past Surgical History:  Procedure Laterality Date  . ADENOIDECTOMY    . BACK SURGERY    . TONSILLECTOMY         No family history on file.  Social History   Tobacco Use  . Smoking status: Current Every Day Smoker    Packs/day: 0.50    Types: Cigarettes  . Smokeless tobacco: Never Used  Substance Use Topics  . Alcohol use: Not Currently  . Drug use: Not Currently    Home Medications Prior to Admission medications   Medication Sig Start Date End Date Taking? Authorizing Provider    HYDROcodone-acetaminophen (NORCO/VICODIN) 5-325 MG tablet Take 1-2 tablets by mouth every 6 (six) hours as needed for severe pain. 08/13/16   Duffy Bruce, MD  HYDROcodone-acetaminophen (NORCO/VICODIN) 5-325 MG tablet Take 1 tablet by mouth every 6 (six) hours as needed for severe pain. 01/15/20   Suzy Bouchard, PA-C  methocarbamol (ROBAXIN) 500 MG tablet Take 1 tablet (500 mg total) by mouth 2 (two) times daily. 01/15/20   Suzy Bouchard, PA-C  naproxen (NAPROSYN) 500 MG tablet Take 1 tablet (500 mg total) by mouth 2 (two) times daily. 01/15/20   Suzy Bouchard, PA-C    Allergies    Lactose intolerance (gi)  Review of Systems   Review of Systems  Constitutional: Negative for chills and fever.  Genitourinary: Negative for difficulty urinating.  Musculoskeletal: Positive for back pain and neck pain.  Neurological: Positive for numbness (chronic). Negative for weakness and headaches.    Physical Exam Updated Vital Signs BP 133/86   Pulse 86   Temp 98.7 F (37.1 C)   Resp 18   Ht 5\' 10"  (1.778 m)   Wt 72.6 kg   SpO2 99%   BMI 22.96 kg/m   Physical Exam Vitals and nursing note reviewed.  Constitutional:      General: He is not in acute distress.    Appearance: He is not ill-appearing.  HENT:     Head: Normocephalic.  Eyes:     Pupils: Pupils are equal, round, and reactive  to light.  Neck:     Comments: No cervical midline tenderness. Cardiovascular:     Rate and Rhythm: Normal rate and regular rhythm.     Pulses: Normal pulses.     Heart sounds: Normal heart sounds. No murmur. No friction rub. No gallop.   Pulmonary:     Effort: Pulmonary effort is normal.     Breath sounds: Normal breath sounds.  Abdominal:     General: Abdomen is flat. There is no distension.     Palpations: Abdomen is soft.     Tenderness: There is no abdominal tenderness. There is no guarding or rebound.  Musculoskeletal:     Cervical back: Neck supple.     Comments: No T-spine and  L-spine midline tenderness, no stepoff or deformity, no paraspinal tenderness No leg edema bilaterally Patient moves all extremities without difficulty. DP/PT pulses 2+ and equal bilaterally Sensation grossly intact bilaterally Strength of knee flexion and extension is 5/5 Plantar and dorsiflexion of ankle 5/5 Able to ambulate without difficulty   Skin:    General: Skin is warm and dry.     Findings: No rash.  Neurological:     General: No focal deficit present.     Mental Status: He is alert.  Psychiatric:        Mood and Affect: Mood normal.        Behavior: Behavior normal.     ED Results / Procedures / Treatments   Labs (all labs ordered are listed, but only abnormal results are displayed) Labs Reviewed - No data to display  EKG None  Radiology No results found.  Procedures Procedures (including critical care time)  Medications Ordered in ED Medications  HYDROcodone-acetaminophen (NORCO/VICODIN) 5-325 MG per tablet 1 tablet (has no administration in time range)  methocarbamol (ROBAXIN) tablet 500 mg (has no administration in time range)  lidocaine (LIDODERM) 5 % 1 patch (has no administration in time range)    ED Course  I have reviewed the triage vital signs and the nursing notes.  Pertinent labs & imaging results that were available during my care of the patient were reviewed by me and considered in my medical decision making (see chart for details).    MDM Rules/Calculators/A&P                     34 year old male presents to the ED due to acute on chronic cervical pain that radiates down left shoulder and occasionally down back into the left leg.  Patient has a history of a cervical spinal fusion roughly 3 years ago.  He denies saddle paresthesias, bowel/bladder incontinence, lower extremity numbness/tingling, lower extremity weakness, IV drug use, fever/chills, and history of cancer.  Upon arrival, vitals all within normal limits.  Patient is afebrile, not  tachycardic or hypoxic.  Patient in no acute distress and nontoxic-appearing.  No cervical, thoracic, or lumbar midline tenderness.  Patient able to ambulate in the ED without difficulty.  No overlying rash in left shoulder region to suggest shingles. Equal grip strength bilaterally. Suspect symptoms related to cervical radiculopathy. No concern for central cord compression. PERC negative. Doubt shoulder pain related to PE. Patient given hydrocodone, Robaxin, and a Lidoderm patch for symptomatic relief here in the ED. Given there was no recent injury with no midline tenderness, no images warranted at this time. Will discharge patient with pain medication. Neurosurgery number given to patient at discharge. Advised patient to call the office tomorrow for further evaluation. Strict ED precautions discussed  with patient. Patient states understanding and agrees to plan. Patient discharged home in no acute distress and stable vitals.   Final Clinical Impression(s) / ED Diagnoses Final diagnoses:  Cervical radiculopathy    Rx / DC Orders ED Discharge Orders         Ordered    methocarbamol (ROBAXIN) 500 MG tablet  2 times daily     01/15/20 2328    naproxen (NAPROSYN) 500 MG tablet  2 times daily     01/15/20 2328    HYDROcodone-acetaminophen (NORCO/VICODIN) 5-325 MG tablet  Every 6 hours PRN     01/15/20 2328           Mannie Stabile, PA-C 01/15/20 2339    Gwyneth Sprout, MD 01/16/20 0008

## 2020-01-16 NOTE — ED Notes (Signed)
Patient verbalizes understanding of discharge instructions. Opportunity for questioning and answers were provided. Armband removed by staff, pt discharged from ED. Pt. ambulatory and discharged home.  

## 2020-01-24 ENCOUNTER — Other Ambulatory Visit: Payer: Self-pay | Admitting: Neurosurgery

## 2020-01-24 DIAGNOSIS — M4722 Other spondylosis with radiculopathy, cervical region: Secondary | ICD-10-CM

## 2020-02-21 ENCOUNTER — Ambulatory Visit
Admission: RE | Admit: 2020-02-21 | Discharge: 2020-02-21 | Disposition: A | Discharge status: Home / Self Care | Payer: 59 | Source: Ambulatory Visit | Attending: Neurosurgery | Admitting: Neurosurgery

## 2020-02-21 ENCOUNTER — Other Ambulatory Visit: Payer: Self-pay

## 2020-02-21 DIAGNOSIS — M4722 Other spondylosis with radiculopathy, cervical region: Secondary | ICD-10-CM

## 2020-06-20 DIAGNOSIS — F411 Generalized anxiety disorder: Secondary | ICD-10-CM | POA: Insufficient documentation

## 2020-06-20 DIAGNOSIS — F410 Panic disorder [episodic paroxysmal anxiety] without agoraphobia: Secondary | ICD-10-CM | POA: Diagnosis not present

## 2020-06-20 DIAGNOSIS — Z20822 Contact with and (suspected) exposure to covid-19: Secondary | ICD-10-CM | POA: Diagnosis not present

## 2020-06-20 DIAGNOSIS — F419 Anxiety disorder, unspecified: Secondary | ICD-10-CM | POA: Diagnosis present

## 2020-06-20 DIAGNOSIS — F1290 Cannabis use, unspecified, uncomplicated: Secondary | ICD-10-CM | POA: Insufficient documentation

## 2020-06-20 DIAGNOSIS — Z79899 Other long term (current) drug therapy: Secondary | ICD-10-CM | POA: Insufficient documentation

## 2020-06-20 DIAGNOSIS — F332 Major depressive disorder, recurrent severe without psychotic features: Secondary | ICD-10-CM | POA: Diagnosis not present

## 2020-06-20 NOTE — H&P (Signed)
Behavioral Health Medical Screening Exam  Philip Lee is an 34 y.o. male who presents to [facility] as a walk-in due to worsening depression, anxiety, suicidal thoughts. Patient denies suicide plan, but repeatedly states that he is afraid that he may harm himself if he leaves the hospital. Patient appears exteremly anxious throughout the assessment. He appears to have involuntary movements of his head/neck. He states that the movements are related to having a spinal fusion. He reports regular use of marijuana. Occasional use of alcohol. Denies use of other substances.   Total Time spent with patient: 15 minutes  Psychiatric Specialty Exam: Physical Exam Constitutional:      General: He is not in acute distress.    Appearance: He is not ill-appearing, toxic-appearing or diaphoretic.  Pulmonary:     Effort: Pulmonary effort is normal. No respiratory distress.  Neurological:     Mental Status: He is alert and oriented to person, place, and time.  Psychiatric:        Mood and Affect: Mood is anxious and depressed.        Thought Content: Thought content includes suicidal ideation.    Review of Systems Blood pressure (!) 130/91, pulse 84, temperature 97.8 F (36.6 C), temperature source Oral, SpO2 100 %.There is no height or weight on file to calculate BMI. General Appearance: Fairly Groomed Eye Contact:  Fair Speech:  Clear and Coherent Volume:  Increased Mood:  Anxious, Depressed, Hopeless and Worthless Affect:  Congruent Thought Process:  Coherent and Goal Directed Orientation:  Full (Time, Place, and Person) Thought Content:  Logical and Hallucinations: None Suicidal Thoughts:  Yes.  without intent/plan Homicidal Thoughts:  No Memory:  Immediate;   Good Recent;   Fair Remote;   Fair Judgement:  Impaired Insight:  Lacking Psychomotor Activity:  Increased and Restlessness Concentration: Concentration: Poor and Attention Span: Poor Recall:  Good Fund of Knowledge:Good Language:  Good Akathisia:  Negative Handed:  Right AIMS (if indicated):    Assets:  Communication Skills Desire for Improvement Financial Resources/Insurance Housing Physical Health Sleep:      Blood pressure (!) 130/91, pulse 84, temperature 97.8 F (36.6 C), temperature source Oral, SpO2 100 %.  Recommendations: Based on my evaluation the patient does not appear to have an emergency medical condition.  Jackelyn Poling, NP 06/20/2020, 11:56 PM

## 2020-06-21 ENCOUNTER — Inpatient Hospital Stay (HOSPITAL_COMMUNITY)
Admission: AD | Admit: 2020-06-21 | Discharge: 2020-06-24 | DRG: 885 | Disposition: A | Discharge status: Home / Self Care | Payer: 59 | Attending: Psychiatry | Admitting: Psychiatry

## 2020-06-21 ENCOUNTER — Other Ambulatory Visit: Payer: Self-pay

## 2020-06-21 ENCOUNTER — Ambulatory Visit (HOSPITAL_COMMUNITY): Admission: AD | Admit: 2020-06-21 | Payer: 59 | Source: Home / Self Care | Admitting: Psychiatry

## 2020-06-21 ENCOUNTER — Encounter (HOSPITAL_COMMUNITY): Payer: Self-pay | Admitting: Nurse Practitioner

## 2020-06-21 DIAGNOSIS — F1290 Cannabis use, unspecified, uncomplicated: Secondary | ICD-10-CM | POA: Diagnosis not present

## 2020-06-21 DIAGNOSIS — Z981 Arthrodesis status: Secondary | ICD-10-CM

## 2020-06-21 DIAGNOSIS — R4587 Impulsiveness: Secondary | ICD-10-CM | POA: Diagnosis present

## 2020-06-21 DIAGNOSIS — F411 Generalized anxiety disorder: Secondary | ICD-10-CM

## 2020-06-21 DIAGNOSIS — G4700 Insomnia, unspecified: Secondary | ICD-10-CM | POA: Diagnosis present

## 2020-06-21 DIAGNOSIS — M542 Cervicalgia: Secondary | ICD-10-CM | POA: Diagnosis present

## 2020-06-21 DIAGNOSIS — F410 Panic disorder [episodic paroxysmal anxiety] without agoraphobia: Secondary | ICD-10-CM | POA: Diagnosis present

## 2020-06-21 DIAGNOSIS — E739 Lactose intolerance, unspecified: Secondary | ICD-10-CM | POA: Diagnosis present

## 2020-06-21 DIAGNOSIS — R45851 Suicidal ideations: Secondary | ICD-10-CM | POA: Diagnosis present

## 2020-06-21 DIAGNOSIS — F332 Major depressive disorder, recurrent severe without psychotic features: Principal | ICD-10-CM | POA: Diagnosis present

## 2020-06-21 DIAGNOSIS — F17210 Nicotine dependence, cigarettes, uncomplicated: Secondary | ICD-10-CM | POA: Diagnosis present

## 2020-06-21 LAB — LIPID PANEL
Cholesterol: 191 mg/dL (ref 0–200)
HDL: 53 mg/dL (ref >40)
LDL Cholesterol: 123 mg/dL — ABNORMAL HIGH (ref 0–99)
Total CHOL/HDL Ratio: 3.6 RATIO
Triglycerides: 76 mg/dL (ref <150)
VLDL: 15 mg/dL (ref 0–40)

## 2020-06-21 LAB — COMPREHENSIVE METABOLIC PANEL
ALT: 24 U/L (ref 0–44)
AST: 32 U/L (ref 15–41)
Albumin: 4.6 g/dL (ref 3.5–5.0)
Alkaline Phosphatase: 58 U/L (ref 38–126)
Anion gap: 10 (ref 5–15)
BUN: 18 mg/dL (ref 6–20)
CO2: 26 mmol/L (ref 22–32)
Calcium: 9.3 mg/dL (ref 8.9–10.3)
Chloride: 102 mmol/L (ref 98–111)
Creatinine, Ser: 1.07 mg/dL (ref 0.61–1.24)
GFR, Estimated: >60 mL/min (ref >60)
Glucose, Bld: 102 mg/dL — ABNORMAL HIGH (ref 70–99)
Potassium: 3.7 mmol/L (ref 3.5–5.1)
Sodium: 138 mmol/L (ref 135–145)
Total Bilirubin: 1.4 mg/dL — ABNORMAL HIGH (ref 0.3–1.2)
Total Protein: 6.9 g/dL (ref 6.5–8.1)

## 2020-06-21 LAB — CBC
HCT: 47.3 % (ref 39.0–52.0)
Hemoglobin: 16.8 g/dL (ref 13.0–17.0)
MCH: 30.9 pg (ref 26.0–34.0)
MCHC: 35.5 g/dL (ref 30.0–36.0)
MCV: 86.9 fL (ref 80.0–100.0)
Platelets: 289 10*3/uL (ref 150–400)
RBC: 5.44 MIL/uL (ref 4.22–5.81)
RDW: 12.2 % (ref 11.5–15.5)
WBC: 7.8 10*3/uL (ref 4.0–10.5)
nRBC: 0 % (ref 0.0–0.2)

## 2020-06-21 LAB — RESPIRATORY PANEL BY RT PCR (FLU A&B, COVID)
Influenza A by PCR: NEGATIVE
Influenza B by PCR: NEGATIVE
SARS Coronavirus 2 by RT PCR: NEGATIVE

## 2020-06-21 LAB — TSH: TSH: 0.627 u[IU]/mL (ref 0.350–4.500)

## 2020-06-21 MED ORDER — MAGNESIUM HYDROXIDE 400 MG/5ML PO SUSP: 30.0000 mL | Freq: Every day | ORAL | Status: DC | PRN

## 2020-06-21 MED ORDER — HYDROXYZINE HCL 25 MG PO TABS
25.0000 mg | ORAL_TABLET | Freq: Three times a day (TID) | ORAL | Status: DC | PRN
  Administered 2020-06-21 – 2020-06-23 (×3): 25 mg via ORAL
  Filled 2020-06-21 (×3): qty 1

## 2020-06-21 MED ORDER — ACETAMINOPHEN 325 MG PO TABS: 650.0000 mg | ORAL_TABLET | Freq: Four times a day (QID) | ORAL | Status: DC | PRN

## 2020-06-21 MED ORDER — ALUM & MAG HYDROXIDE-SIMETH 200-200-20 MG/5ML PO SUSP: 30.0000 mL | ORAL | Status: DC | PRN

## 2020-06-21 MED ORDER — DULOXETINE HCL 20 MG PO CPEP
20.0000 mg | ORAL_CAPSULE | Freq: Every day | ORAL | Status: DC
  Administered 2020-06-21 – 2020-06-22 (×2): 20 mg via ORAL
  Filled 2020-06-21 (×4): qty 1

## 2020-06-21 MED ORDER — LORAZEPAM 1 MG PO TABS
1.0000 mg | ORAL_TABLET | Freq: Once | ORAL | Status: AC
  Administered 2020-06-21: 1 mg via ORAL
  Filled 2020-06-21: qty 1

## 2020-06-21 MED ORDER — TRAZODONE HCL 50 MG PO TABS
50.0000 mg | ORAL_TABLET | Freq: Every evening | ORAL | Status: DC | PRN
  Administered 2020-06-21 – 2020-06-23 (×3): 50 mg via ORAL
  Filled 2020-06-21 (×3): qty 1

## 2020-06-21 NOTE — BHH Group Notes (Signed)
Occupational Therapy Group Note Date: 06/21/2020 Group Topic/Focus: Coping Skills and Brain Fitness  Group Description: Group encouraged increased social engagement and participation through discussion/activity focused on brain fitness. Patients were provided education on various brain fitness activities/strategies, with explanation provided on the qualifying factors including: one, that is has to be challenging/hard and two, it has to be something that you do not do every day. Patients engaged actively during group session in various brain fitness activities to increase attention, concentration, and problem-solving skills. Discussion followed with a focus on identifying the benefits of brain fitness activities as use for adaptive coping strategies and distraction.    Therapeutic Goal(s): Identify benefit(s) of brain fitness activities as use for adaptive coping and healthy distraction. Identify specific brain fitness activities to engage in as use for adaptive coping and healthy distraction.  Participation Level: Active   Participation Quality: Independent   Behavior: Cooperative and Interactive   Speech/Thought Process: Focused   Affect/Mood: Full range   Insight: Fair   Judgement: Fair   Individualization: Damen was active in his participation of discussion and activity, however became frustrated with activity and gave up. Pt not receptive to support, though noted difficulty with spelling and was unable to engage further in last activity. Noted benefit of engaging in brain fitness activities and other distractions to promote adaptive coping.   Modes of Intervention: Activity, Discussion, Education and Problem-solving  Patient Response to Interventions:  Attentive, Engaged and Receptive   Plan: Continue to engage patient in OT groups 2 - 3x/week.   06/21/2020  Donne Hazel, MOT, OTR/L

## 2020-06-21 NOTE — BHH Counselor (Signed)
CSW contacted [facility] Attorney's office [phone] to notify them that the pt was at our hospital due to a court date he had today. Caller stated pt needs to call the clerk of court upon d/c to learn what the judge decided due to his absence. Pt was notified of this information by CSW and given the phone number for the [place] of Court. CSW informed pt that she would give him a note at d/c.  Philip Lee, LCSWA Clinicial Social Worker [employer]
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
## 2020-06-21 NOTE — Progress Notes (Signed)
Pt denies SI/HI/AVH.  Pt says that he is anxious and depressed.  Pt reports that he is having marital issues which is contributing to his depressive thoughts and increased anxiety. RN established rapport with pt and assessed for needs/concerns.  RN administered medications per provider orders.  Pt did not have any adverse effects to medications. RN will continue to monitor and provide support and provide assistance as needed.
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
## 2020-06-21 NOTE — Progress Notes (Signed)
Pt has been really anxious during his admission assessment. Observed pt to have involuntary movements of his neck and head. Pt has been guided through slow breathing. Pt attributes this to his recent start of panic attacks and anxiety. Also shares that he had a spinal fusion and has gone to a neurologist for the involuntary movements. Notified NP, [name]. 1 time order of 1 mg of Ativan administered at 0329.
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
## 2020-06-21 NOTE — Progress Notes (Signed)
Philip Lee is a 34 y.o. male voluntary admitted for suicide ideation. Pt stated he is having relationship issues with his wife, they are separated but living together in the same house because of their children. Pt stated seeing his wife with another man is a trigger to him. Pt also stated he used abuse drugs and he feels like he is going to the that direction again. Pt has been observed having movement related to panic attacks. Pt stated whenever he talks about his wife, he gets the panic attacks like movements. Pt denied Si/HI and contracted for safety. Consents signed, skin/belongings search completed and pt oriented to unit. Pt stable at this time. Pt given the opportunity to express concerns and ask questions. Pt given toiletries. Will continue to monitor.

## 2020-06-21 NOTE — Tx Team (Addendum)
Substance abuseInitial Treatment Plan 06/21/2020 5:28 AM [name] CWU:[id number]    PATIENT STRESSORS: Marital or family conflict Medication change or noncompliance Substance abuse   PATIENT STRENGTHS: Capable of independent living Communication skills Motivation for treatment/growth Work skills   PATIENT IDENTIFIED PROBLEMS: Substance abuse  Anxiety  "Feel like myself"  "Be sober"               DISCHARGE CRITERIA:  Ability to meet basic life and health needs Improved stabilization in mood, thinking, and/or behavior Medical problems require only outpatient monitoring Motivation to continue treatment in a less acute level of care  PRELIMINARY DISCHARGE PLAN: Attend aftercare/continuing care group Attend PHP/IOP Outpatient therapy Return to previous living arrangement Return to previous work or school arrangements  PATIENT/FAMILY INVOLVEMENT: This treatment plan has been presented to and reviewed with the patient, Philip Lee, and/or family member.  The patient and family have been given the opportunity to ask questions and make suggestions.  Bethann Punches, RN 06/21/2020, 5:28 AM
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
## 2020-06-22 LAB — HEMOGLOBIN A1C
Hgb A1c MFr Bld: 5.2 % (ref 4.8–5.6)
Mean Plasma Glucose: 103 mg/dL

## 2020-06-22 MED ORDER — DULOXETINE HCL 20 MG PO CPEP
40.0000 mg | ORAL_CAPSULE | Freq: Every day | ORAL | Status: DC
  Administered 2020-06-23 – 2020-06-24 (×2): 40 mg via ORAL
  Filled 2020-06-22 (×4): qty 2

## 2020-06-22 NOTE — Progress Notes (Signed)
   06/21/20 2105  Psych Admission Type (Psych Patients Only)  Admission Status Voluntary  Psychosocial Assessment  Patient Complaints Anxiety  Eye Contact Fair  Facial Expression Anxious;Sad;Worried  Affect Appropriate to circumstance  Speech Logical/coherent  Interaction Assertive  Motor Activity Fidgety  Appearance/Hygiene Improved  Behavior Characteristics Appropriate to situation  Mood Depressed  Aggressive Behavior  Targets Property  Type of Behavior Rage  Effect No apparent injury  Thought Process  Coherency WDL  Content Blaming others  Delusions None reported or observed  Perception WDL  Hallucination None reported or observed  Judgment WDL  Confusion None  Danger to Self  Current suicidal ideation? Denies  Danger to Others  Danger to Others None reported or observed

## 2020-06-22 NOTE — Progress Notes (Signed)
[place] NOVEL CORONAVIRUS (COVID-19) DAILY CHECK-OFF SYMPTOMS - answer yes or no to each - every day NO YES  Have you had a fever in the past 24 hours?  . Fever (Temp > 37.80C / 100F) X   Have you had any of these symptoms in the past 24 hours? . New Cough .  Sore Throat  .  Shortness of Breath .  Difficulty Breathing .  Unexplained Body Aches   X   Have you had any one of these symptoms in the past 24 hours not related to allergies?   . Runny Nose .  Nasal Congestion .  Sneezing   X   If you have had runny nose, nasal congestion, sneezing in the past 24 hours, has it worsened?  X   EXPOSURES - check yes or no X   Have you traveled outside the state in the past 14 days?  X   Have you been in contact with someone with a confirmed diagnosis of COVID-19 or PUI in the past 14 days without wearing appropriate PPE?  X   Have you been living in the same home as a person with confirmed diagnosis of COVID-19 or a PUI (household contact)?    X   Have you been diagnosed with COVID-19?    X              What to do next: Answered NO to all: Answered YES to anything:   Proceed with unit schedule Follow the [facility] Inpatient Flowsheet.   Pt observed in bed on initial approach. Presents guarded, minimal / superficial and fidgety with fair eye contact and logical speech on interactions. Pt remains isolative to his for majority of this shift "I just want to to sleep. I did not sleep last night". Reports good appetite with normal energy and good concentration level. Pt came out of room this evening after writer checked on him in his room demanding immediate d/c, agitated with blunted affect  "I can't do this no more, I can't sleep, I'm anxious, People checking on me every 15 minutes like I'm in prison, I'm missing my family. I want to leave now. I just can't do this". Pt signed a 72 hour request for d/c on 06/22/20 at 1902. Compliant with medications when offered. Denies discomfort / adverse reactions.   Emotional support and encouragement provided to pt throughout this shift. All medications administered with verbal education and effects monitored. Q 15 minutes safety checks maintained without self harm thoughts / gestures. Pt did not attend groups despite multiple prompts. Verbally redirectable at the time. Remains safe on and off unit.

## 2020-06-22 NOTE — Progress Notes (Signed)
Psychoeducational Group Note  Date:  06/22/2020 Time:  1548  Group Topic/Focus:  Emotional Education:   The focus of this group is to discuss what feelings/emotions are, and how they are experienced.  Participation Level: Did Not Attend  Participation Quality:  Not Applicable  Affect:  Not Applicable  Cognitive:  Not Applicable  Insight:  Not Applicable  Engagement in Group: Not Applicable  Additional Comments:  Pt was asleep and could not attend group.  Zachariah Pavek E 06/22/2020, 3:56 PM
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
## 2020-06-22 NOTE — Tx Team (Signed)
Interdisciplinary Treatment and Diagnostic Plan Update  06/22/2020 Time of Session: 10:15am  Philip Lee MRN: [id number]  Principal Diagnosis: Severe recurrent major depression without psychotic features [facility])  Secondary Diagnoses: Principal Problem:   Severe recurrent major depression without psychotic features (HCC) Active Problems:   Panic attack   Generalized anxiety disorder   Current Medications:  Current Facility-Administered Medications  Medication Dose Route Frequency Provider Last Rate Last Admin  . acetaminophen (TYLENOL) tablet 650 mg  650 mg Oral Q6H PRN [name] A, NP      . alum & mag hydroxide-simeth (MAALOX/MYLANTA) 200-200-20 MG/5ML suspension 30 mL  30 mL Oral Q4H PRN Jackelyn Poling, NP      . DULoxetine (CYMBALTA) DR capsule 20 mg  20 mg Oral Daily Karsten Ro, MD   20 mg at 06/22/20 0904  . hydrOXYzine (ATARAX/VISTARIL) tablet 25 mg  25 mg Oral TID PRN [name] A, NP   25 mg at 06/21/20 0329  . magnesium hydroxide (MILK OF MAGNESIA) suspension 30 mL  30 mL Oral Daily PRN [name] A, NP      . traZODone (DESYREL) tablet 50 mg  50 mg Oral QHS PRN [name] A, NP   50 mg at 06/21/20 0329   PTA Medications: Medications Prior to Admission  Medication Sig Dispense Refill Last Dose  . cyclobenzaprine (FLEXERIL) 10 MG tablet Take 10 mg by mouth 3 (three) times daily.     Marland Kitchen FLUoxetine (PROZAC) 20 MG capsule Take 20 mg by mouth daily.       Patient Stressors: Marital or family conflict Medication change or noncompliance Substance abuse  Patient Strengths: Capable of independent living Barrister's clerk for treatment/growth Work skills  Treatment Modalities: Medication Management, Group therapy, Case management,  1 to 1 session with clinician, Psychoeducation, Recreational therapy.   Physician Treatment Plan for Primary Diagnosis: Severe recurrent major depression without psychotic features (HCC) Long Term Goal(s): Improvement  in symptoms so as ready for discharge Improvement in symptoms so as ready for discharge   Short Term Goals: Ability to identify changes in lifestyle to reduce recurrence of condition will improve Ability to verbalize feelings will improve Ability to disclose and discuss suicidal ideas Ability to demonstrate self-control will improve Ability to identify and develop effective coping behaviors will improve Ability to maintain clinical measurements within normal limits will improve Compliance with prescribed medications will improve Ability to identify triggers associated with substance abuse/mental health issues will improve Ability to identify changes in lifestyle to reduce recurrence of condition will improve Ability to verbalize feelings will improve Ability to disclose and discuss suicidal ideas Ability to demonstrate self-control will improve Ability to identify and develop effective coping behaviors will improve Ability to maintain clinical measurements within normal limits will improve Compliance with prescribed medications will improve Ability to identify triggers associated with substance abuse/mental health issues will improve  Medication Management: Evaluate patient's response, side effects, and tolerance of medication regimen.  Therapeutic Interventions: 1 to 1 sessions, Unit Group sessions and Medication administration.  Evaluation of Outcomes: Progressing  Physician Treatment Plan for Secondary Diagnosis: Principal Problem:   Severe recurrent major depression without psychotic features (HCC) Active Problems:   Panic attack   Generalized anxiety disorder  Long Term Goal(s): Improvement in symptoms so as ready for discharge Improvement in symptoms so as ready for discharge   Short Term Goals: Ability to identify changes in lifestyle to reduce recurrence of condition will improve Ability to verbalize feelings will improve Ability to  disclose and discuss suicidal  ideas Ability to demonstrate self-control will improve Ability to identify and develop effective coping behaviors will improve Ability to maintain clinical measurements within normal limits will improve Compliance with prescribed medications will improve Ability to identify triggers associated with substance abuse/mental health issues will improve Ability to identify changes in lifestyle to reduce recurrence of condition will improve Ability to verbalize feelings will improve Ability to disclose and discuss suicidal ideas Ability to demonstrate self-control will improve Ability to identify and develop effective coping behaviors will improve Ability to maintain clinical measurements within normal limits will improve Compliance with prescribed medications will improve Ability to identify triggers associated with substance abuse/mental health issues will improve     Medication Management: Evaluate patient's response, side effects, and tolerance of medication regimen.  Therapeutic Interventions: 1 to 1 sessions, Unit Group sessions and Medication administration.  Evaluation of Outcomes: Progressing   RN Treatment Plan for Primary Diagnosis: Severe recurrent major depression without psychotic features (HCC) Long Term Goal(s): Knowledge of disease and therapeutic regimen to maintain health will improve  Short Term Goals: Ability to remain free from injury will improve, Ability to demonstrate self-control, Ability to participate in decision making will improve, Ability to disclose and discuss suicidal ideas and Ability to identify and develop effective coping behaviors will improve  Medication Management: RN will administer medications as ordered by provider, will assess and evaluate patient's response and provide education to patient for prescribed medication. RN will report any adverse and/or side effects to prescribing provider.  Therapeutic Interventions: 1 on 1 counseling sessions,  Psychoeducation, Medication administration, Evaluate responses to treatment, Monitor vital signs and CBGs as ordered, Perform/monitor CIWA, COWS, AIMS and Fall Risk screenings as ordered, Perform wound care treatments as ordered.  Evaluation of Outcomes: Progressing   LCSW Treatment Plan for Primary Diagnosis: Severe recurrent major depression without psychotic features (HCC) Long Term Goal(s): Safe transition to appropriate next level of care at discharge, Engage patient in therapeutic group addressing interpersonal concerns.  Short Term Goals: Engage patient in aftercare planning with referrals and resources, Increase social support, Increase emotional regulation, Facilitate acceptance of mental health diagnosis and concerns, Identify triggers associated with mental health/substance abuse issues and Increase skills for wellness and recovery  Therapeutic Interventions: Assess for all discharge needs, 1 to 1 time with Social worker, Explore available resources and support systems, Assess for adequacy in community support network, Educate family and significant other(s) on suicide prevention, Complete Psychosocial Assessment, Interpersonal group therapy.  Evaluation of Outcomes: Progressing   Progress in Treatment: Attending groups: No. Participating in groups: No. Taking medication as prescribed: Yes. Toleration medication: Yes. Family/Significant other contact made: Yes, individual(s) contacted:  Ex-wife  Patient understands diagnosis: Yes. and No. Discussing patient identified problems/goals with staff: Yes. Medical problems stabilized or resolved: Yes. Denies suicidal/homicidal ideation: Yes. Issues/concerns per patient self-inventory: No.   New problem(s) identified: No, Describe:  None   New Short Term/Long Term Goal(s): medication stabilization, elimination of SI thoughts, development of comprehensive mental wellness plan.   Patient Goals:  "To feel more comfortable with myself  and to be able to rely on others more"  Discharge Plan or Barriers: Patient recently admitted. CSW will continue to follow and assess for appropriate referrals and possible discharge planning.   Reason for Continuation of Hospitalization: Aggression Depression Medication stabilization Suicidal ideation  Estimated Length of Stay: 3 to 5 days   Attendees: Patient: Philip Lee  06/22/2020   Physician: Earlene Plater, MD 06/22/2020  Nursing:  06/22/2020   RN Care Manager: 06/22/2020   Social Worker: Melba Coon, LCSW 06/22/2020   Recreational Therapist:  06/22/2020   Other:  06/22/2020   Other:  06/22/2020   Other: 06/22/2020     Scribe for Treatment Team: Aram Beecham, LCSWA 06/22/2020 11:48 AM

## 2020-06-23 DIAGNOSIS — F1290 Cannabis use, unspecified, uncomplicated: Secondary | ICD-10-CM | POA: Clinically undetermined

## 2020-06-23 DIAGNOSIS — F332 Major depressive disorder, recurrent severe without psychotic features: Principal | ICD-10-CM
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
## 2020-06-23 NOTE — Progress Notes (Signed)
   06/23/20 1400  Psych Admission Type (Psych Patients Only)  Admission Status Voluntary  Psychosocial Assessment  Patient Complaints Anxiety  Eye Contact Fair  Facial Expression Anxious  Affect Appropriate to circumstance  Speech Logical/coherent  Interaction Assertive  Motor Activity Other (Comment) (WDL)  Appearance/Hygiene Unremarkable  Behavior Characteristics Appropriate to situation  Mood Depressed  Thought Process  Coherency WDL  Content Blaming others  Delusions None reported or observed  Perception WDL  Hallucination None reported or observed  Judgment Poor  Confusion None  Danger to Self  Current suicidal ideation? Denies  Danger to Others  Danger to Others None reported or observed   Pt reports he came to the hospital wanting help with panic attacks. Pt is mildly anxious and participating on the unit. He reports the bed aggravated his back last night.  Offered support, encouragement and 15 minute checks. Pt denies si and hi. Safety maintained on the unit.

## 2020-06-23 NOTE — Progress Notes (Signed)
Patient spent most of shift resting in bed, he was visible for a small time in the dayroom, he told writer he just wants to leave and go home. He came to medication room to take medications although. He denied any issues on shift. He had minimal interactions with peers and staff. He appears to be in bed resting quietly at this time.

## 2020-06-23 NOTE — Progress Notes (Signed)
[facility] Group Notes:  (Nursing/MHT/Case Management/Adjunct)  Date:  06/23/2020  Time:  3:04 PM  Type of Therapy:  Nurse Education  Participation Level:  Active  Participation Quality:  Appropriate and Attentive  Affect:  Appropriate  Cognitive:  Alert and Appropriate  Insight:  Improving  Engagement in Group:  Engaged  Modes of Intervention:  Activity, Discussion, Education, Socialization and Support  Summary of Progress/Problems:The purpose of this group is to introduce patients to the benefits and safety of aromatherapy. Pt rates his day as a 7 and says it would be better if he could see his children. Pt was attentive and appropriate during group.   Beatrix Shipper 06/23/2020, 3:04 PM

## 2020-06-23 NOTE — Progress Notes (Signed)
The patient rated his day as an 8 out of 10 since she did not have any "negative thoughts" and because he went outdoors. His goal for tomorrow is to feel more comfortable with himself.

## 2020-06-24 DIAGNOSIS — F410 Panic disorder [episodic paroxysmal anxiety] without agoraphobia: Secondary | ICD-10-CM

## 2020-06-24 DIAGNOSIS — F411 Generalized anxiety disorder: Secondary | ICD-10-CM

## 2020-06-24 DIAGNOSIS — F1290 Cannabis use, unspecified, uncomplicated: Secondary | ICD-10-CM

## 2020-06-24 MED ORDER — DULOXETINE HCL 40 MG PO CPEP: 40.0000 mg | ORAL_CAPSULE | Freq: Every day | ORAL | 0 refills | Status: AC

## 2020-06-24 MED ORDER — HYDROXYZINE HCL 25 MG PO TABS: 25.0000 mg | ORAL_TABLET | Freq: Three times a day (TID) | ORAL | 0 refills | Status: AC | PRN

## 2020-06-24 MED ORDER — TRAZODONE HCL 50 MG PO TABS: 50.0000 mg | ORAL_TABLET | Freq: Every evening | ORAL | 0 refills | Status: AC | PRN

## 2020-06-24 NOTE — Progress Notes (Addendum)
  [facility] Adult Case Management Discharge Plan :  Will you be returning to the same living situation after discharge:  Yes,  with spouse and children. At discharge, do you have transportation home?: Yes,  car is here at facility Do you have the ability to pay for your medications : Yes, pt reports no barriers  Release of information consent forms completed and in the chart;  Patient's signature needed at discharge.  Patient to Follow up at:  Follow-up Information    BEHAVIORAL HEALTH OUTPATIENT THERAPY [place] Follow up on 07/02/2020.   Specialty: Behavioral Health Why: You have an appointment on 07/02/20 at 2:00 pm for therapy services.  This will be a Virtual appointment.  Contact information: [address] [id number] mc [place] [address] [phone]       [facility] Go on 07/09/2020.   Why: You have an appointment for medication management on 07/09/20 at 10:00 am.  This appointment will be held in person.  Contact information: [address] [place], [place] [address]  Phone: [phone]  Fax: [phone]               Next level of care provider has access to [facility] Link:no  Safety Planning and Suicide Prevention discussed: Yes,  completed by another CSW with [facility] on 10/14.  Have you used any form of tobacco in the last 30 days? (Cigarettes, Smokeless Tobacco, Cigars, and/or Pipes): Yes  Has patient been referred to the Quitline?: Yes, Pt interested in referral.   Patient has been referred for addiction treatment: Yes Pt f/u is listed above.   Shellia Cleverly, LCSW 06/24/2020, 1:12 PM

## 2020-06-24 NOTE — [facility] Suicide Risk Assessment (Signed)
[facility] Discharge Suicide Risk Assessment   Principal Problem: Severe recurrent major depression without psychotic features [facility]) Discharge Diagnoses: Principal Problem:   Severe recurrent major depression without psychotic features (HCC) Active Problems:   Panic attack   Generalized anxiety disorder   Marijuana use   Total Time spent with patient: 20 minutes   Patient is seen today, he is calm and cooperative alert and oriented x4.  He states that he came to the hospital due to severe anxiety and panic attacks.  He knew that he needed to, "change things up."  He reports that since he has been started on Cymbalta he feels less anxious.  He states that his panic attacks would cause pain in his neck where he has had surgery in the past.  He notes that since he is on Cymbalta his nerve pain in his neck is under control, and he has not been having panic attacks as he was prior to admission.  He states that since he has been in the hospital he has gotten to know himself better, and states he is able to put coping mechanisms into place upon discharge.  He is denying any suicidal or homicidal ideation.  He is denying any auditory or visual hallucinations.  He does have guns, but they have been secured and he does not have access to them.  He states he has a 32-year-old and 50-year-old child that he is looking forward to spending time with, and states that he has a sense of responsibility to them that is protective against him ever making a suicide attempt.  Musculoskeletal: Strength & Muscle Tone: within normal limits Gait & Station: normal Patient leans: N/A  Psychiatric Specialty Exam: Review of Systems  Constitutional: Negative.   Respiratory: Negative.   Cardiovascular: Negative.   Gastrointestinal: Negative.   Neurological: Negative.   Psychiatric/Behavioral: Negative for agitation, behavioral problems, confusion, decreased concentration, dysphoric mood, hallucinations, self-injury, sleep  disturbance and suicidal ideas. The patient is not nervous/anxious and is not hyperactive.     Blood pressure 106/65, pulse (!) 51, temperature 98.5 F (36.9 C), temperature source Oral, resp. rate 20, height 5\' 10"  (1.778 m), weight 73.5 kg, SpO2 100 %.Body mass index is 23.24 kg/m.  General Appearance: Casual and Neat  Eye Contact::  Good  Speech:  Clear and Coherent and Normal Rate409  Volume:  Normal  Mood:  Euthymic  Affect:  Congruent  Thought Process:  Coherent, Goal Directed, Linear and Descriptions of Associations: Intact  Orientation:  Full (Time, Place, and Person)  Thought Content:  Logical and Hallucinations: None  Suicidal Thoughts:  No  Homicidal Thoughts:  No  Memory:  Immediate;   Good Recent;   Good Remote;   Good  Judgement:  Good  Insight:  Good  Psychomotor Activity:  Normal  Concentration:  Good  Recall:  Good  Fund of Knowledge:Good  Language: Good  Akathisia:  No  Handed:  Right  AIMS (if indicated):     Assets:  Communication Skills Desire for Improvement Financial Resources/Insurance Housing Intimacy Leisure Time Physical Health Resilience Social Support  Sleep:  Number of Hours: 6.75  Cognition: WNL  ADL's:  Intact   Mental Status Per Nursing Assessment::   On Admission:  NA  Demographic Factors:  Male and Caucasian  Loss Factors: none  Historical Factors: Passive SI, anxiety  Risk Reduction Factors:   Responsible for children under 38 years of age, Sense of responsibility to family, Employed, Living with another person, especially a relative, Positive  social support, Positive therapeutic relationship and Positive coping skills or problem solving skills  Continued Clinical Symptoms:  Anxiety  Depression Marijuana use  Cognitive Features That Contribute To Risk:  None    Suicide Risk:  Minimal: No identifiable suicidal ideation.  Patients presenting with no risk factors but with morbid ruminations; may be classified as  minimal risk based on the severity of the depressive symptoms   Follow-up Information    BEHAVIORAL HEALTH OUTPATIENT THERAPY [place] Follow up on 07/02/2020.   Specialty: Behavioral Health Why: You have an appointment on 07/02/20 at 2:00 pm for therapy services.  This will be a Virtual appointment.  Contact information: [address] [id number] mc [place] [address] [phone]       [facility] Go on 07/09/2020.   Why: You have an appointment for medication management on 07/09/20 at 10:00 am.  This appointment will be held in person.  Contact information: [address] [place], [place] [address]  Phone: [phone]   Fax: [phone]               Plan Of Care/Follow-up recommendations:  Activity:  ad lib Diet:  as tolerated   On day of discharge following sustained improvement in the affect of this patient, continued report of euthymic mood, repeated denial of suicidal, homicidal, and other violent ideation, adequate interaction with peers, active participation in groups while on the unit, and denial of adverse reactions from medications, the treatment team decided Philip Lee was stable for discharge home with scheduled mental health treatment.  He was able to engage in safety planning including plan to return to [facility] or contact emergency services if he feels unable to maintain his own safety or the safety of others. Patient had no further questions, comments, or concerns. Discharge into care of self and family, who agrees to maintain patient safety.  Patient aware to return to nearest crisis center, ED or to call 911 for worsening symptoms of depression, suicidal or homicidal thoughts or AVH.    Mariel Craft, MD 06/24/2020, 1:08 PM

## 2020-06-24 NOTE — Progress Notes (Signed)
Discharge Note:  Patient denies SI/HI AVH at this time. Discharge instructions, AVS, prescriptions and transition record gone over with patient. Patient agrees to comply with medication management, follow-up visit, and outpatient therapy. Patient belongings returned to patient. Patient questions and concerns addressed and answered.  Patient ambulatory off unit.  Patient discharged to home.   
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
## 2020-06-24 NOTE — [facility] Group Notes (Signed)
LCSW Group Therapy Note  06/24/2020   10:00-11:00am   Type of Therapy and Topic:  Group Therapy: Anger Cues and Responses  Participation Level:  Minimal   Description of Group:   In this group, patients learned how to recognize the physical, cognitive, emotional, and behavioral responses they have to anger-provoking situations.  They identified a recent time they became angry and how they reacted.  They analyzed how their reaction was possibly beneficial and how it was possibly unhelpful.  The group discussed a variety of healthier coping skills that could help with such a situation in the future.  Focus was placed on how helpful it is to recognize the underlying emotions to our anger, because working on those can lead to a more permanent solution as well as our ability to focus on the important rather than the urgent.  Therapeutic Goals: 1. Patients will remember their last incident of anger and how they felt emotionally and physically, what their thoughts were at the time, and how they behaved. 2. Patients will identify how their behavior at that time worked for them, as well as how it worked against them. 3. Patients will explore possible new behaviors to use in future anger situations. 4. Patients will learn that anger itself is normal and cannot be eliminated, and that healthier reactions can assist with resolving conflict rather than worsening situations.  Summary of Patient Progress:  Pt was present throughout session and spoke a few times. Pt did share that "all emotions lead to anger for me".   Therapeutic Modalities:   Cognitive Behavioral Therapy  Shellia Cleverly

## 2020-06-24 NOTE — Progress Notes (Signed)
   06/24/20 0355  Psych Admission Type (Psych Patients Only)  Admission Status Voluntary  Psychosocial Assessment  Patient Complaints Insomnia;Anxiety (medicated with trazodone & Vistaril for insomnia and anxiety)  Eye Contact Fair  Facial Expression Anxious  Affect Appropriate to circumstance  Speech Logical/coherent  Interaction Assertive  Motor Activity Other (Comment) (WDL)  Appearance/Hygiene Unremarkable  Behavior Characteristics Cooperative  Mood Pleasant  Thought Process  Coherency WDL  Content Blaming others  Delusions None reported or observed  Perception WDL  Hallucination None reported or observed  Judgment Poor  Confusion None  Danger to Self  Current suicidal ideation? Denies  Danger to Others  Danger to Others None reported or observed
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
## 2020-07-02 ENCOUNTER — Ambulatory Visit (HOSPITAL_COMMUNITY): Payer: 59 | Admitting: Licensed Clinical Social Worker

## 2020-07-02 ENCOUNTER — Telehealth (HOSPITAL_COMMUNITY): Payer: Self-pay | Admitting: Licensed Clinical Social Worker

## 2020-07-02 NOTE — Telephone Encounter (Signed)
Philip Lee had an in-person appointment scheduled today for initial clinical assessment.  Philip Lee did not present on time as scheduled, so clinician outreached him by phone at 2:10pm.  Philip Lee did not answer this phone call, and it went directly to voicemail.  Clinician was unable to leave a message, as mailbox was full.  Clinician informed front desk of no-show event when Philip Lee had not appeared by 2:15pm.    Philip Stain, LCSW, LCAS 07/02/20

## 2020-11-28 IMAGING — MR MR CERVICAL SPINE W/O CM
4 of 6 series · 25 of 48 positions shown · non-contrast
Comparison: Plain films January 20, 2020

CLINICAL DATA: Cervical spondylosis with radiculopathy.

EXAM:
MRI CERVICAL SPINE WITHOUT CONTRAST
TECHNIQUE: Multiplanar, multisequence MR imaging of the cervical spine was
performed. No intravenous contrast was administered.

[Series 2: T2 · sagittal · 3.0mm · 0.41mm/px · 6 of 13 slices shown (1 of 3)]
[im 1/13]
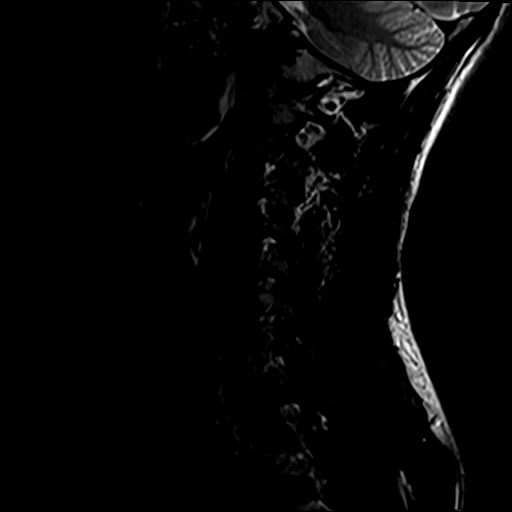
[im 3/13]
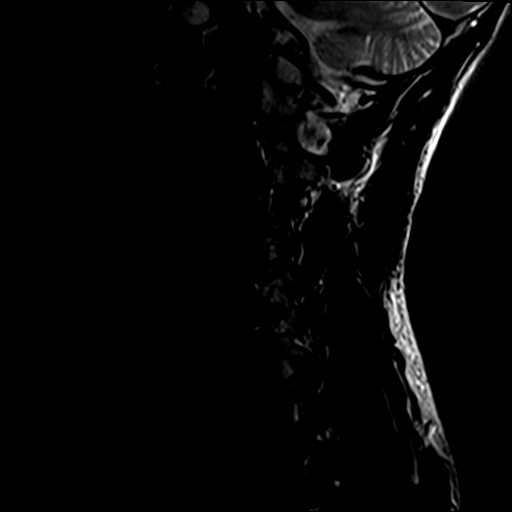
[im 5/13]
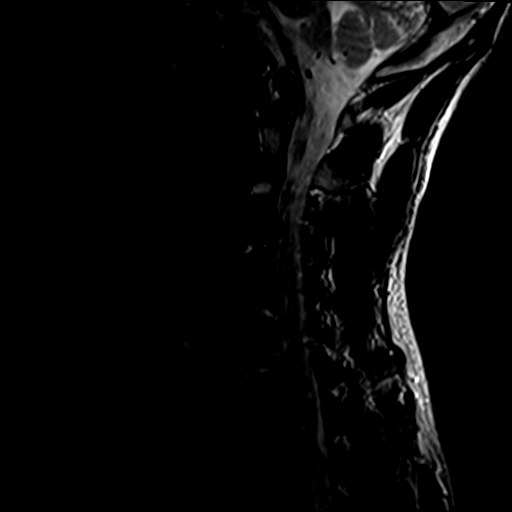
[im 8/13]
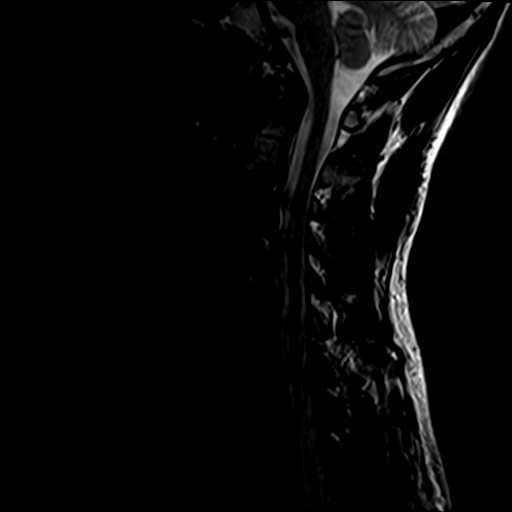
[im 10/13]
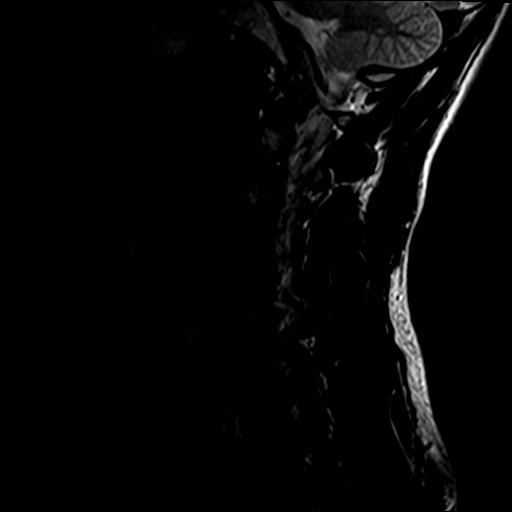
[im 13/13]
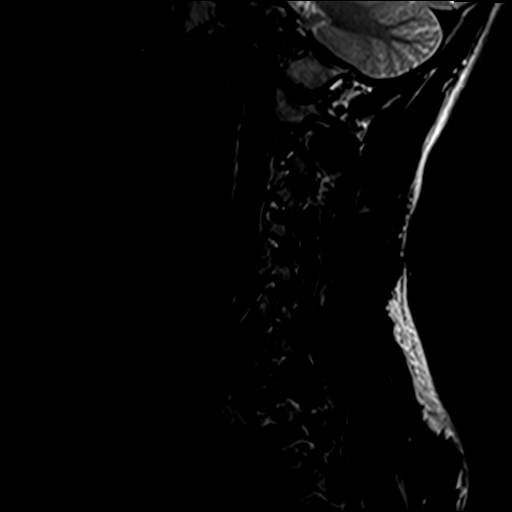

[Series 3: T1 · sagittal · 3.0mm · 0.41mm/px · 4 of 13 slices shown]
[im 1/13]
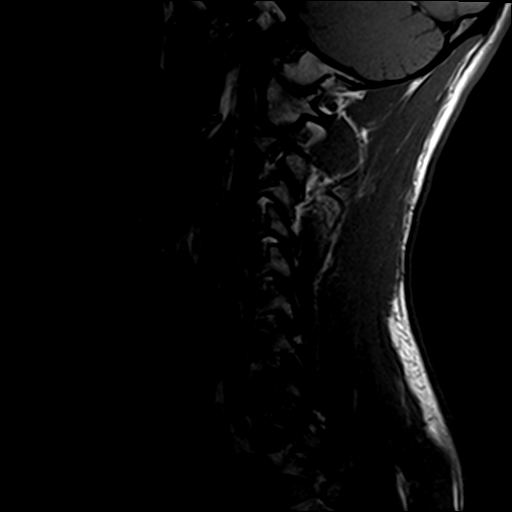
[im 3/13]
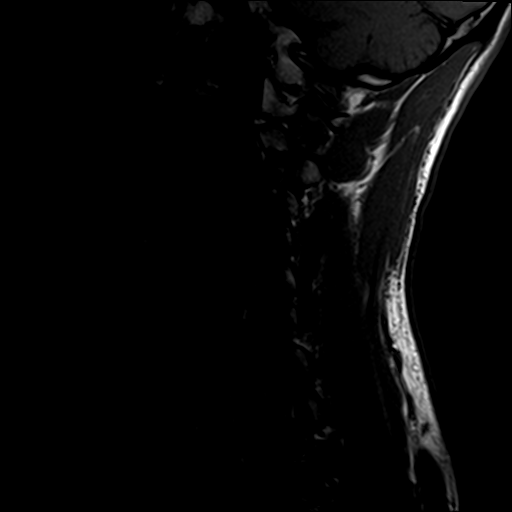
[im 8/13]
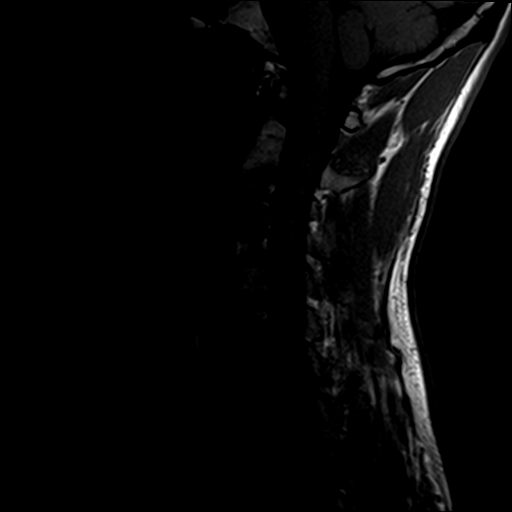
[im 13/13]
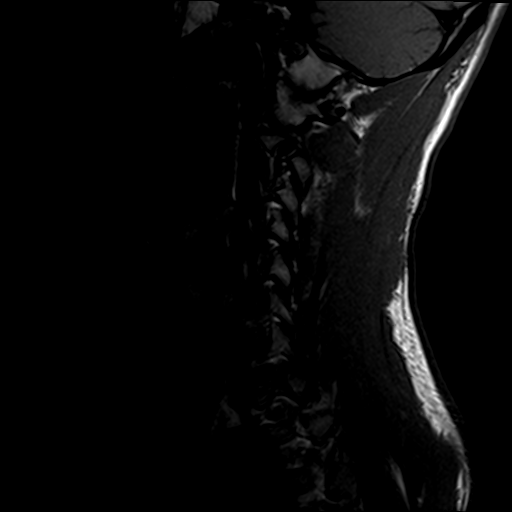

[Series 5: T2 · sagittal · 3.0mm · 0.41mm/px · 6 of 13 slices shown (2 of 3)]
[im 1/13]
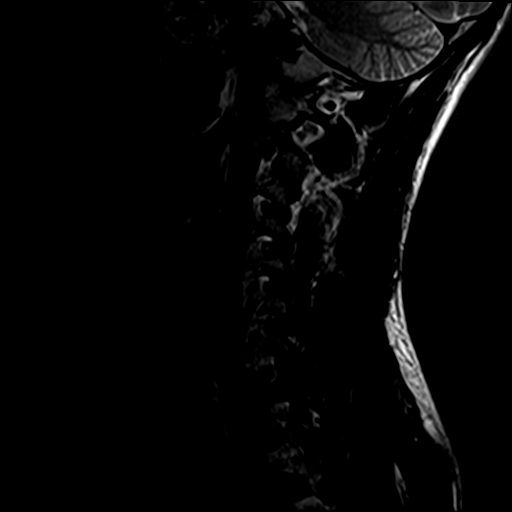
[im 3/13]
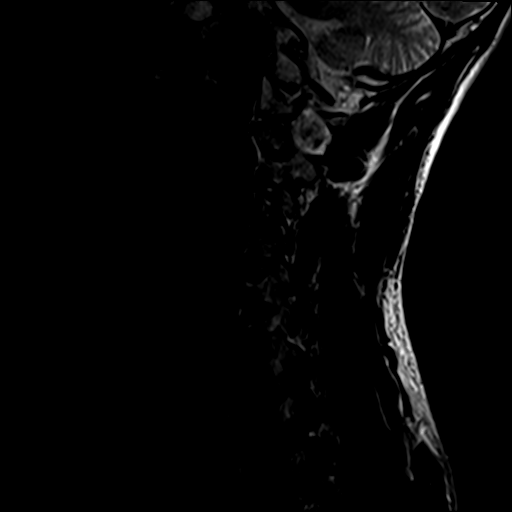
[im 5/13]
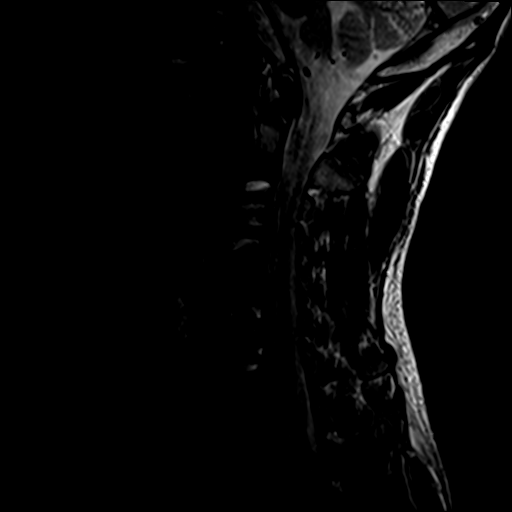
[im 8/13]
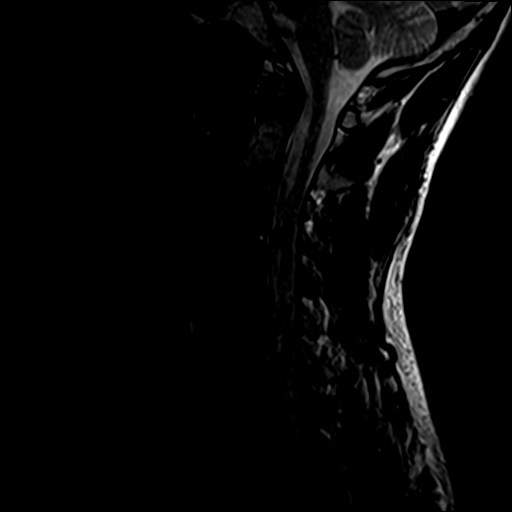
[im 10/13]
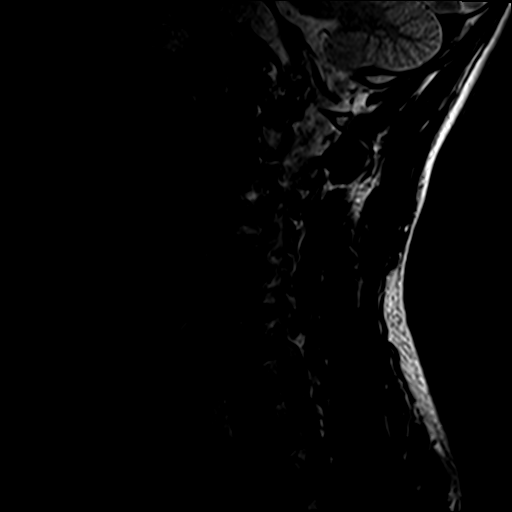
[im 13/13]
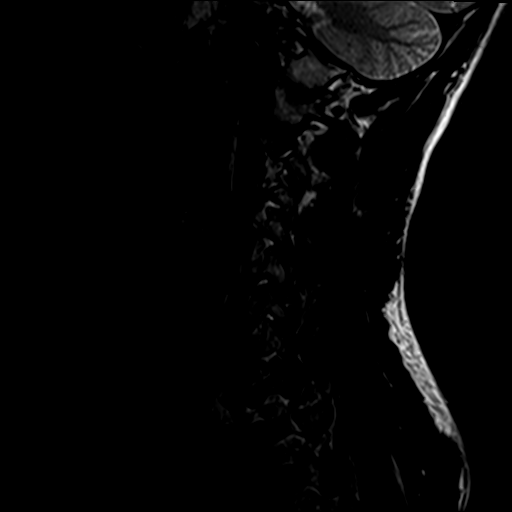

[Series 7: T2 · axial · 3.0mm · 0.70mm/px · z∈[-67,+24]mm · 9 of 26 slices shown (3 of 3)]
[im 1/26]
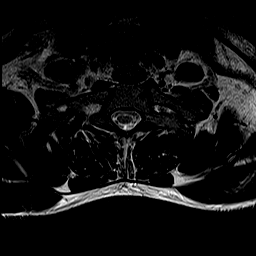
[im 5/26]
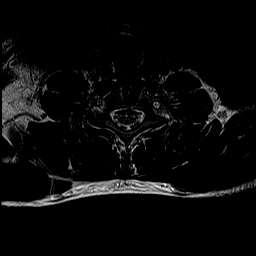
[im 7/26]
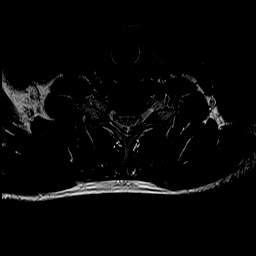
[im 12/26]
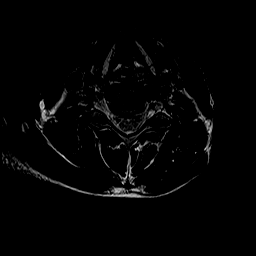
[im 14/26]
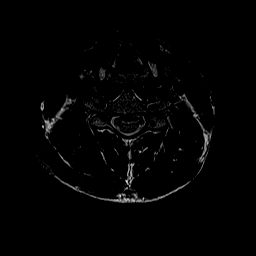
[im 19/26]
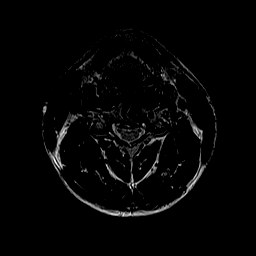
[im 21/26]
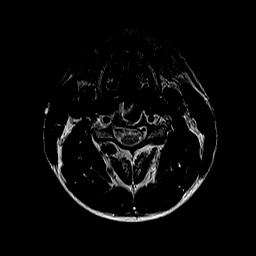
[im 23/26]
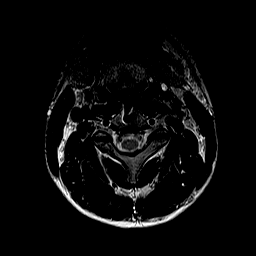
[im 26/26]
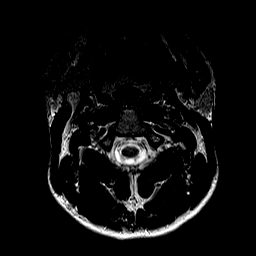

[25 of 48 positions shown; findings below may reference images not displayed]

FINDINGS: Alignment: Physiologic.

Vertebrae: No fracture, evidence of discitis, or bone lesion. Status
post ACDF at C3-4 and C6-7.

Cord: Normal signal and morphology.

Posterior Fossa, vertebral arteries, paraspinal tissues: Negative.

Disc levels:

C2-3: Mild facet degenerative changes. No spinal canal or neural
foraminal stenosis.

C3-4: Mild facet degenerative changes without significant spinal
canal or neural foraminal stenosis.

C4-5: Small posterior disc protrusion causing small indentation of
the thecal sac without significant spinal canal or neural foraminal
stenosis.

C5-6: Small posterior disc protrusion causing small indentation of
the thecal sac without significant spinal canal or neural foraminal
stenosis.

C6-7: No spinal canal or neural foraminal stenosis.

C7-T1: Mild facet degenerative changes. No spinal canal or neural
foraminal stenosis.
IMPRESSION: 1. Status post ACDF at C3-4 and C6-7.
2. Mild facet degenerative changes at C2-3, C3-4 and C7-T1.
3. No significant spinal canal or neural foraminal stenosis at any
level.

## 2021-07-26 ENCOUNTER — Encounter (HOSPITAL_BASED_OUTPATIENT_CLINIC_OR_DEPARTMENT_OTHER): Payer: Self-pay | Admitting: Emergency Medicine

## 2021-07-26 ENCOUNTER — Emergency Department (HOSPITAL_BASED_OUTPATIENT_CLINIC_OR_DEPARTMENT_OTHER)
Admission: EM | Admit: 2021-07-26 | Discharge: 2021-07-26 | Disposition: A | Discharge status: Home / Self Care | Payer: Managed Care, Other (non HMO) | Attending: Emergency Medicine | Admitting: Emergency Medicine

## 2021-07-26 ENCOUNTER — Other Ambulatory Visit: Payer: Self-pay

## 2021-07-26 ENCOUNTER — Emergency Department (HOSPITAL_BASED_OUTPATIENT_CLINIC_OR_DEPARTMENT_OTHER): Payer: Managed Care, Other (non HMO)

## 2021-07-26 DIAGNOSIS — F17210 Nicotine dependence, cigarettes, uncomplicated: Secondary | ICD-10-CM | POA: Insufficient documentation

## 2021-07-26 DIAGNOSIS — S6991XA Unspecified injury of right wrist, hand and finger(s), initial encounter: Secondary | ICD-10-CM | POA: Diagnosis present

## 2021-07-26 DIAGNOSIS — Y990 Civilian activity done for income or pay: Secondary | ICD-10-CM | POA: Diagnosis not present

## 2021-07-26 DIAGNOSIS — S60121A Contusion of right index finger with damage to nail, initial encounter: Secondary | ICD-10-CM | POA: Diagnosis not present

## 2021-07-26 DIAGNOSIS — W230XXA Caught, crushed, jammed, or pinched between moving objects, initial encounter: Secondary | ICD-10-CM | POA: Insufficient documentation

## 2021-07-26 DIAGNOSIS — S6010XA Contusion of unspecified finger with damage to nail, initial encounter: Secondary | ICD-10-CM

## 2021-07-26 DIAGNOSIS — Y9289 Other specified places as the place of occurrence of the external cause: Secondary | ICD-10-CM | POA: Diagnosis not present

## 2021-07-26 MED ORDER — OXYCODONE HCL 5 MG PO TABS
5.0000 mg | ORAL_TABLET | Freq: Once | ORAL | Status: AC
  Administered 2021-07-26: 5 mg via ORAL
  Filled 2021-07-26: qty 1

## 2021-07-26 MED ORDER — LIDOCAINE HCL (PF) 1 % IJ SOLN
30.0000 mL | Freq: Once | INTRAMUSCULAR | Status: AC
  Administered 2021-07-26: 30 mL
  Filled 2021-07-26: qty 30

## 2021-07-26 NOTE — ED Provider Notes (Signed)
[facility]EMERGENCY DEPT [facility] Emergency Department Provider Note MRN:  [id number]  Arrival date & time: 07/26/21     Chief Complaint   Finger Injury   History of Present Illness   Philip Lee is a 35 y.o. year-old male with no pertinent past medical history presenting to the ED with chief complaint of finger injury.  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  Review of Systems  A problem-focused ROS was performed. Positive for finger injury.  Patient denies other injuries.  Patient's Health History    Past Medical History:  Diagnosis Date   Cervical pain     Past Surgical History:  Procedure Laterality Date   ADENOIDECTOMY     BACK SURGERY     TONSILLECTOMY      Family History  Problem Relation Age of Onset   Diabetes Mother     Social History   Socioeconomic History   Marital status: Married    Spouse name: Not on file   Number of children: Not on file   Years of education: Not on file   Highest education level: Not on file  Occupational History   Not on file  Tobacco Use   Smoking status: Every Day    Packs/day: 0.50    Types: Cigarettes   Smokeless tobacco: Never  Vaping Use   Vaping Use: Never used  Substance and Sexual Activity   Alcohol use: Yes    Comment: seldom   Drug use: Yes    Types: Marijuana    Comment: occ   Sexual activity: Not on file  Other Topics Concern   Not on file  Social History Narrative   Not on file   Social Determinants of Health   Financial Resource Strain: Not on file  Food Insecurity: Not on file   Transportation Needs: Not on file  Physical Activity: Not on file  Stress: Not on file  Social Connections: Not on file  Intimate Partner Violence: Not on file     Physical Exam   Vitals:   07/26/21 0446  BP: (!) 138/95  Pulse: 72  Resp: 20  Temp: 98.3 F (36.8 C)  SpO2: 99%    CONSTITUTIONAL: Well-appearing, NAD NEURO:  Alert and oriented x 3, no focal deficits EYES:  eyes equal and reactive ENT/NECK:  no LAD, no JVD CARDIO: Regular rate, well-perfused, normal S1 and S2 PULM:  CTAB no wheezing or rhonchi GI/GU:  normal bowel sounds, non-distended, non-tender MSK/SPINE:  No gross deformities, no edema SKIN:  no rash, atraumatic PSYCH:  Appropriate speech and behavior  *Additional and/or pertinent findings included in MDM below  Diagnostic and Interventional Summary    EKG Interpretation  Date/Time:    Ventricular Rate:    PR Interval:    QRS Duration:   QT Interval:    QTC Calculation:   R Axis:     Text Interpretation:         Labs Reviewed - No data to display  DG Hand Complete Right  Final Result      Medications  oxyCODONE (Oxy IR/ROXICODONE) immediate release tablet 5 mg (5 mg Oral Given 07/26/21 0454)  lidocaine (PF) (XYLOCAINE)  1 % injection 30 mL (30 mLs Infiltration Given by Other 07/26/21 0604)     Procedures  /  Critical Care .Nerve Block  Date/Time: 07/26/2021 6:24 AM Performed by: Sabas Sous, MD Authorized by: Sabas Sous, MD   Consent:    Consent obtained:  Verbal   Consent given by:  Patient   Risks, benefits, and alternatives were discussed: yes     Risks discussed:  Allergic reaction, bleeding, intravenous injection, infection, nerve damage, pain, unsuccessful block and swelling   Alternatives discussed:  No treatment Universal protocol:    Procedure explained and questions answered to patient or proxy's satisfaction: yes     Immediately prior to procedure, a time out was called: yes     Patient identity confirmed:   Verbally with patient Indications:    Indications:  Procedural anesthesia Location:    Nerve block body site: Pointer finger.   Laterality:  Right Pre-procedure details:    Skin preparation:  Chlorhexidine   Preparation: Patient was prepped and draped in usual sterile fashion   Procedure details:    Block needle gauge:  27 G   Anesthetic injected:  Lidocaine 1% w/o epi   Injection procedure:  Anatomic landmarks identified and incremental injection   Paresthesia:  None Post-procedure details:    Dressing:  None   Outcome:  Anesthesia achieved   Procedure completion:  Tolerated well, no immediate complications .Marland KitchenIncision and Drainage  Date/Time: 07/26/2021 6:24 AM Performed by: Sabas Sous, MD Authorized by: Sabas Sous, MD   Consent:    Consent obtained:  Verbal   Consent given by:  Patient   Risks, benefits, and alternatives were discussed: yes     Risks discussed:  Bleeding, damage to other organs, infection, incomplete drainage and pain Universal protocol:    Procedure explained and questions answered to patient or proxy's satisfaction: yes     Immediately prior to procedure, a time out was called: yes     Patient identity confirmed:  Verbally with patient Location:    Type:  Subungual hematoma   Size:  1cm   Location: Right pointer finger. Pre-procedure details:    Skin preparation:  Chlorhexidine with alcohol Sedation:    Sedation type:  None Anesthesia:    Anesthesia method:  Nerve block Procedure type:    Complexity:  Simple Procedure details:    Incision type: Bovie pen single-point entry.   Drainage:  Bloody   Drainage amount:  Moderate Post-procedure details:    Procedure completion:  Tolerated well, no immediate complications Comments:     Successful drainage of subungual hematoma with immediate release of dark red blood  ED Course and Medical Decision Making  I have reviewed the triage vital signs, the nursing notes, and pertinent available  records from the EMR.  Listed above are laboratory and imaging tests that I personally ordered, reviewed, and interpreted and then considered in my medical decision making (see below for details).  Finger injury, is neurovascularly intact, does not seem to have a gross deformity, there is a small subungual hematoma, awaiting x-ray.     X-ray is negative.  Repeat exam suggests that patient's pain is largely stemming from the subungual hematoma.  Drained as described above, appropriate for discharge.  Elmer Sow. Pilar Plate, MD [facility] Emergency Medicine [facility] [email or web address]  Final Clinical Impressions(s) / ED Diagnoses     ICD-10-CM   1. Subungual hematoma of digit of hand, initial encounter  S60.10XA  ED Discharge Orders     None        Discharge Instructions Discussed with and Provided to Patient:     Discharge Instructions      You were evaluated in the Emergency Department and after careful evaluation, we did not find any emergent condition requiring admission or further testing in the hospital.  Your exam/testing today was overall reassuring.  We drained your subungual hematoma here in the emergency department.  The area may drain blood for the next few hours.  This should help your pain.  Recommend Tylenol or Motrin as needed.  Your x-ray did not show any broken bones.  Please return to the Emergency Department if you experience any worsening of your condition.  Thank you for allowing [place] to be a part of your care.         Sabas Sous, MD 07/26/21 [phone]

## 2021-07-26 NOTE — Discharge Instructions (Signed)
You were evaluated in the Emergency Department and after careful evaluation, we did not find any emergent condition requiring admission or further testing in the hospital.  Your exam/testing today was overall reassuring.  We drained your subungual hematoma here in the emergency department.  The area may drain blood for the next few hours.  This should help your pain.  Recommend Tylenol or Motrin as needed.  Your x-ray did not show any broken bones.  Please return to the Emergency Department if you experience any worsening of your condition.  Thank you for allowing [place] to be a part of your care.

## 2021-07-26 NOTE — ED Triage Notes (Signed)
Pt states he injured his right index finger tonight at work  Pt has swelling noted

## 2022-05-03 ENCOUNTER — Encounter (HOSPITAL_BASED_OUTPATIENT_CLINIC_OR_DEPARTMENT_OTHER): Payer: Self-pay | Admitting: Emergency Medicine

## 2022-05-03 ENCOUNTER — Other Ambulatory Visit: Payer: Self-pay

## 2022-05-03 ENCOUNTER — Emergency Department (HOSPITAL_BASED_OUTPATIENT_CLINIC_OR_DEPARTMENT_OTHER)
Admission: EM | Admit: 2022-05-03 | Discharge: 2022-05-03 | Disposition: A | Discharge status: Home / Self Care | Payer: Managed Care, Other (non HMO) | Attending: Emergency Medicine | Admitting: Emergency Medicine

## 2022-05-03 DIAGNOSIS — L03211 Cellulitis of face: Secondary | ICD-10-CM | POA: Insufficient documentation

## 2022-05-03 MED ORDER — CLINDAMYCIN HCL 300 MG PO CAPS: 300.0000 mg | ORAL_CAPSULE | Freq: Four times a day (QID) | ORAL | 0 refills | Status: AC

## 2022-05-03 MED ORDER — CLINDAMYCIN HCL 150 MG PO CAPS
300.0000 mg | ORAL_CAPSULE | Freq: Once | ORAL | Status: AC
  Administered 2022-05-03: 300 mg via ORAL
  Filled 2022-05-03: qty 2

## 2022-05-03 NOTE — ED Triage Notes (Signed)
Pt states popped a bump on right side of face 2 days ago now has swelling and pain to the area.

## 2022-05-03 NOTE — ED Provider Notes (Signed)
[facility] EMERGENCY DEPARTMENT Provider Note   CSN: [id number] Arrival date & time: 05/03/22  0133     History  Chief Complaint  Patient presents with   Abscess    Philip Lee is a 36 y.o. male.  The history is provided by the patient.  Illness Location:  R lower cheek along the jaw line Quality:  Swelling Severity:  Moderate Onset quality:  Gradual Duration:  2 days Timing:  Constant Progression:  Worsening Chronicity:  New Context:  States he cannot be tooth related as they were all removed Relieved by:  Nothing Worsened by:  Nothing Ineffective treatments:  None Associated symptoms: no fever        Home Medications Prior to Admission medications   Medication Sig Start Date End Date Taking? Authorizing Provider  clindamycin (CLEOCIN) 300 MG capsule Take 1 capsule (300 mg total) by mouth 4 (four) times daily. X 7 days 05/03/22  Yes Aynslee Mulhall, MD  DULoxetine 40 MG CPEP Take 40 mg by mouth daily. 06/25/20   Aldean Baker, NP  hydrOXYzine (ATARAX/VISTARIL) 25 MG tablet Take 1 tablet (25 mg total) by mouth 3 (three) times daily as needed for anxiety. 06/24/20   Aldean Baker, NP  traZODone (DESYREL) 50 MG tablet Take 1 tablet (50 mg total) by mouth at bedtime as needed for sleep. 06/24/20   Aldean Baker, NP      Allergies    Lactose intolerance (gi)    Review of Systems   Review of Systems  Constitutional:  Negative for fever.  HENT:  Positive for facial swelling.   All other systems reviewed and are negative.   Physical Exam Updated Vital Signs BP (!) 130/91 (BP Location: Right Arm)   Pulse 85   Temp 97.8 F (36.6 C) (Oral)   Resp 20   Ht 5\' 10"  (1.778 m)   Wt 77.1 kg   SpO2 100%   BMI 24.39 kg/m  Physical Exam Constitutional:      General: He is not in acute distress.    Appearance: He is well-developed. He is not diaphoretic.  HENT:     Head: Normocephalic and atraumatic.      Nose: Nose normal.  Eyes:      Conjunctiva/sclera: Conjunctivae normal.     Pupils: Pupils are equal, round, and reactive to light.  Cardiovascular:     Rate and Rhythm: Normal rate and regular rhythm.  Pulmonary:     Effort: Pulmonary effort is normal.     Breath sounds: Normal breath sounds. No wheezing or rales.  Abdominal:     General: Bowel sounds are normal.     Palpations: Abdomen is soft.     Tenderness: There is no abdominal tenderness. There is no guarding or rebound.  Musculoskeletal:        General: Normal range of motion.     Cervical back: Normal range of motion and neck supple.  Skin:    General: Skin is warm and dry.  Neurological:     Mental Status: He is alert and oriented to person, place, and time.     ED Results / Procedures / Treatments   Labs (all labs ordered are listed, but only abnormal results are displayed) Labs Reviewed - No data to display  EKG None  Radiology No results found.  Procedures Procedures    Medications Ordered in ED Medications  clindamycin (CLEOCIN) capsule 300 mg (300 mg Oral Given 05/03/22 0220)    ED Course/ Medical  Decision Making/ A&P                           Medical Decision Making Swelling over the R lower jaw   Amount and/or Complexity of Data Reviewed External Data Reviewed: notes.    Details: Previous notes reviewed   Risk Prescription drug management. Risk Details: Bedside US without abscess.  I am still concerned it could be odontogenic in origin given location.  This is not an abscess at this time.  I will start clindamycin and have patient follow up with facial if it symptoms persist.  Strict return precautions   Final Clinical Impression(s) / ED Diagnoses Final diagnoses:  Facial cellulitis   Return for intractable cough, coughing up blood, fevers > 100.4 unrelieved by medication, shortness of breath, intractable vomiting, chest pain, shortness of breath, weakness, numbness, changes in speech, facial asymmetry, abdominal pain,  passing out, Inability to tolerate liquids or food, cough, altered mental status or any concerns. No signs of systemic illness or infection. The patient is nontoxic-appearing on exam and vital signs are within normal limits.  I have reviewed the triage vital signs and the nursing notes. Pertinent labs & imaging results that were available during my care of the patient were reviewed by me and considered in my medical decision making (see chart for details). After history, exam, and medical workup I feel the patient has been appropriately medically screened and is safe for discharge home. Pertinent diagnoses were discussed with the patient. Patient was given return precautions.  Rx / DC Orders ED Discharge Orders          Ordered    clindamycin (CLEOCIN) 300 MG capsule  4 times daily        05/03/22 0223              Dasan Hardman, MD 05/03/22 1696

## 2023-04-16 DIAGNOSIS — J069 Acute upper respiratory infection, unspecified: Secondary | ICD-10-CM | POA: Diagnosis not present

## 2023-04-16 DIAGNOSIS — Z03818 Encounter for observation for suspected exposure to other biological agents ruled out: Secondary | ICD-10-CM | POA: Diagnosis not present

## 2023-04-16 DIAGNOSIS — R051 Acute cough: Secondary | ICD-10-CM | POA: Diagnosis not present

## 2024-09-25 ENCOUNTER — Other Ambulatory Visit: Payer: Self-pay

## 2024-09-25 ENCOUNTER — Emergency Department (HOSPITAL_BASED_OUTPATIENT_CLINIC_OR_DEPARTMENT_OTHER)
Admission: EM | Admit: 2024-09-25 | Discharge: 2024-09-25 | Disposition: A | Discharge status: Home / Self Care | Attending: Emergency Medicine | Admitting: Emergency Medicine

## 2024-09-25 ENCOUNTER — Emergency Department (HOSPITAL_BASED_OUTPATIENT_CLINIC_OR_DEPARTMENT_OTHER)

## 2024-09-25 DIAGNOSIS — S0501XA Injury of conjunctiva and corneal abrasion without foreign body, right eye, initial encounter: Secondary | ICD-10-CM | POA: Insufficient documentation

## 2024-09-25 MED ORDER — FLUORESCEIN SODIUM 1 MG OP STRP
1.0000 | ORAL_STRIP | Freq: Once | OPHTHALMIC | Status: AC
  Administered 2024-09-25: 1 via OPHTHALMIC
  Filled 2024-09-25: qty 1

## 2024-09-25 MED ORDER — OFLOXACIN 0.3 % OP SOLN: 1.0000 [drp] | Freq: Four times a day (QID) | OPHTHALMIC | Status: DC

## 2024-09-25 MED ORDER — OFLOXACIN 0.3 % OP SOLN
1.0000 [drp] | Freq: Four times a day (QID) | OPHTHALMIC | Status: DC
  Administered 2024-09-25: 1 [drp] via OPHTHALMIC
  Filled 2024-09-25: qty 5

## 2024-09-25 MED ORDER — TETRACAINE HCL 0.5 % OP SOLN
2.0000 [drp] | Freq: Once | OPHTHALMIC | Status: AC
  Administered 2024-09-25: 2 [drp] via OPHTHALMIC
  Filled 2024-09-25: qty 4
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
